# Patient Record
Sex: Male | Born: 1950 | Hispanic: No | Marital: Married | Smoking: Former smoker
Health system: Southern US, Community
[De-identification: ages and names within clinical notes are randomized; demographics above are authoritative.]

## PROBLEM LIST (undated history)

## (undated) DIAGNOSIS — E119 Type 2 diabetes mellitus without complications: Secondary | ICD-10-CM

## (undated) DIAGNOSIS — I10 Essential (primary) hypertension: Secondary | ICD-10-CM

## (undated) DIAGNOSIS — J45909 Unspecified asthma, uncomplicated: Secondary | ICD-10-CM

## (undated) DIAGNOSIS — K219 Gastro-esophageal reflux disease without esophagitis: Secondary | ICD-10-CM

## (undated) HISTORY — DX: Essential (primary) hypertension: I10

## (undated) HISTORY — DX: Unspecified asthma, uncomplicated: J45.909

## (undated) HISTORY — DX: Type 2 diabetes mellitus without complications: E11.9

## (undated) HISTORY — PX: HERNIA REPAIR: SHX51

## (undated) HISTORY — DX: Gastro-esophageal reflux disease without esophagitis: K21.9

---

## 2007-10-21 ENCOUNTER — Encounter: Payer: Self-pay | Admitting: Gastroenterology

## 2007-10-23 ENCOUNTER — Encounter: Payer: Self-pay | Admitting: Gastroenterology

## 2007-11-06 ENCOUNTER — Encounter: Payer: Self-pay | Admitting: Internal Medicine

## 2007-11-09 ENCOUNTER — Encounter: Payer: Self-pay | Admitting: Gastroenterology

## 2007-11-09 DIAGNOSIS — D371 Neoplasm of uncertain behavior of stomach: Secondary | ICD-10-CM | POA: Insufficient documentation

## 2007-11-09 DIAGNOSIS — D378 Neoplasm of uncertain behavior of other specified digestive organs: Secondary | ICD-10-CM

## 2007-11-09 DIAGNOSIS — D375 Neoplasm of uncertain behavior of rectum: Secondary | ICD-10-CM

## 2007-12-03 ENCOUNTER — Ambulatory Visit: Payer: Self-pay | Admitting: Gastroenterology

## 2007-12-03 ENCOUNTER — Ambulatory Visit (HOSPITAL_COMMUNITY): Admission: RE | Admit: 2007-12-03 | Discharge: 2007-12-03 | Payer: Self-pay | Admitting: Gastroenterology

## 2010-10-17 LAB — BASIC METABOLIC PANEL
BUN: 21
Calcium: 8.9
GFR calc non Af Amer: >60
Potassium: 4.8
Sodium: 138

## 2010-10-17 LAB — GLUCOSE, CAPILLARY: Glucose-Capillary: 119 — ABNORMAL HIGH

## 2015-02-23 ENCOUNTER — Ambulatory Visit (INDEPENDENT_AMBULATORY_CARE_PROVIDER_SITE_OTHER): Payer: Medicaid Other | Admitting: Allergy and Immunology

## 2015-02-23 ENCOUNTER — Encounter: Payer: Self-pay | Admitting: Allergy and Immunology

## 2015-02-23 VITALS — BP 118/72 | HR 80 | Temp 97.7°F | Resp 20 | Ht 69.0 in | Wt 205.0 lb

## 2015-02-23 DIAGNOSIS — J309 Allergic rhinitis, unspecified: Secondary | ICD-10-CM | POA: Diagnosis not present

## 2015-02-23 DIAGNOSIS — J4541 Moderate persistent asthma with (acute) exacerbation: Secondary | ICD-10-CM | POA: Diagnosis not present

## 2015-02-23 DIAGNOSIS — H1010 Acute atopic conjunctivitis, unspecified eye: Secondary | ICD-10-CM

## 2015-02-23 MED ORDER — METHYLPREDNISOLONE ACETATE 80 MG/ML IJ SUSP
80.0000 mg | Freq: Once | INTRAMUSCULAR | Status: AC
Start: 2015-02-23 — End: 2015-02-23
  Administered 2015-02-23: 80 mg via INTRAMUSCULAR

## 2015-02-23 MED ORDER — FLUTICASONE-SALMETEROL 500-50 MCG/DOSE IN AEPB: 1.0000 | INHALATION_SPRAY | Freq: Two times a day (BID) | RESPIRATORY_TRACT | Status: DC

## 2015-02-23 MED ORDER — ALBUTEROL SULFATE HFA 108 (90 BASE) MCG/ACT IN AERS: 2.0000 | INHALATION_SPRAY | RESPIRATORY_TRACT | Status: DC | PRN

## 2015-02-23 NOTE — Progress Notes (Signed)
Follow-up Note  Referring Provider: No ref. provider found Primary Provider: Nilda Simmer, MD Date of Office Visit: 02/23/2015  Subjective:   Bobby Wheeler (DOB: [birth year]) is a 65 y.o. male who returns to the [facility] on 02/23/2015 in re-evaluation of the following:  HPI Comments: [name] presents this clinic with complaints of wheezing and coughing and nasal congestion without any ugly nasal discharge or ugly sputum production or chest pain or high fever. This started approximately one week ago. Initially he did have chills. He unfortunately ran out of his Advair about 2 weeks ago and does not have a short acting bronchodilator. Apparently he was doing quite well while using Advair over the course of the past 6 months without any exacerbations of his asthma until this event. On occasion he'll use an antihistamine for upper airway symptoms.   Current outpatient prescriptions:  .  amLODipine-benazepril (LOTREL) 10-20 MG capsule, Take 1 capsule by mouth daily. , Disp: , Rfl:  .  aspirin EC 81 MG tablet, 1 mg., Disp: , Rfl:  .  atorvastatin (LIPITOR) 20 MG tablet, Take 20 mg by mouth daily. , Disp: , Rfl:  .  esomeprazole (NEXIUM) 40 MG capsule, 40 mg 2 (two) times daily before a meal. , Disp: , Rfl:  .  hydrochlorothiazide (HYDRODIURIL) 25 MG tablet, Take 25 mg by mouth daily. , Disp: , Rfl:  .  ibuprofen (ADVIL,MOTRIN) 800 MG tablet, Take 800 mg by mouth every 8 (eight) hours as needed. , Disp: , Rfl:  .  metFORMIN (GLUCOPHAGE) 1000 MG tablet, 1,000 mg 2 (two) times daily with a meal. , Disp: , Rfl:  .  zolpidem (AMBIEN CR) 12.5 MG CR tablet, Take 12.5 mg by mouth at bedtime as needed for sleep., Disp: , Rfl:  .  albuterol (PROAIR HFA) 108 (90 Base) MCG/ACT inhaler, Inhale 2 puffs into the lungs every 4 (four) hours as needed for wheezing or shortness of breath. Reported on 02/23/2015, Disp: , Rfl:  .  Fluticasone-Salmeterol (ADVAIR DISKUS) 500-50 MCG/DOSE AEPB, Inhale 1 puff  into the lungs 2 (two) times daily., Disp: , Rfl:   No orders of the defined types were placed in this encounter.    Past Medical History  Diagnosis Date  . Asthma   . Hypertension   . Diabetes [facility])     Past Surgical History  Procedure Laterality Date  . Hernia repair      No Known Allergies  Review of systems negative except as noted in HPI / PMHx or noted below:  Review of Systems  Constitutional: Negative.   HENT: Negative.   Eyes: Negative.   Respiratory: Negative.   Cardiovascular: Negative.   Gastrointestinal: Negative.   Genitourinary: Negative.   Musculoskeletal: Negative.   Skin: Negative.   Neurological: Negative.   Endo/Heme/Allergies: Negative.   Psychiatric/Behavioral: Negative.      Objective:   Filed Vitals:   02/23/15 1633  BP: 118/72  Pulse: 80  Resp: 20   Height: 5\' 9"  (175.3 cm)  Weight: 205 lb 0.4 oz (93 kg)   Physical Exam  Constitutional: He is well-developed, well-nourished, and in no distress.  HENT:  Head: Normocephalic.  Right Ear: Tympanic membrane, external ear and ear canal normal.  Left Ear: Tympanic membrane, external ear and ear canal normal.  Nose: Nose normal. No mucosal edema or rhinorrhea.  Mouth/Throat: Uvula is midline, oropharynx is clear and moist and mucous membranes are normal. No oropharyngeal exudate.  Eyes: Conjunctivae are normal.  Neck: Trachea normal. No tracheal tenderness present. No tracheal deviation present. No thyromegaly present.  Cardiovascular: Normal rate, regular rhythm, S1 normal, S2 normal and normal heart sounds.   No murmur heard. Pulmonary/Chest: No stridor. No respiratory distress. He has wheezes (bilateral inspiratory and expiratory wheezes). He has no rales.  Musculoskeletal: He exhibits no edema.  Lymphadenopathy:       Head (right side): No tonsillar adenopathy present.       Head (left side): No tonsillar adenopathy present.    He has no cervical adenopathy.    He has no axillary  adenopathy.  Neurological: He is alert. Gait normal.  Skin: No rash noted. He is not diaphoretic. No erythema. Nails show no clubbing.  Psychiatric: Mood and affect normal.    Diagnostics:    Spirometry was performed and demonstrated an FEV1 of 2.22 at 67 % of predicted.  The patient had an Asthma Control Test with the following results:  .    Assessment and Plan:   1. Asthma, not well controlled, moderate persistent, with acute exacerbation   2. Allergic rhinoconjunctivitis      1. Ipratropium/albuterol nebulization delivered in the clinic  2. Depo-Medrol 80 mg IM delivered in the clinic  3. Restart Advair 500 one inhalation twice a day  4. Use ProAir HFA 2 puffs every 4-6 hours if needed  5. Can use over-the-counter nasal saline if needed  6. Can use over-the-counter antihistamine if needed  7. Contact clinic if not doing well with plan  8. Return to clinic in 6 months or earlier if problem  I will have Dalles utilize the therapy mentioned above to treat his asthma exacerbation which appeared to be precipitated by a viral respiratory tract infection. If he responds as he has done in the past he will do quite well with this plan and he should have his asthma under good control while consistently using his Advair 500 one inhalation twice a day. He will contact me should he have difficulty with this plan but otherwise we'll see him back in this clinic in 6 months or earlier if there is a problem.  Allena Katz, MD [place]

## 2015-02-23 NOTE — Patient Instructions (Signed)
  1. Ipratropium/albuterol nebulization delivered in the clinic  2. Depo-Medrol 80 mg IM delivered in the clinic  3. Restart Advair 500 one inhalation twice a day  4. Use ProAir HFA 2 puffs every 4-6 hours if needed  5. Can use over-the-counter nasal saline if needed  6. Can use over-the-counter antihistamine if needed  7. Contact clinic if not doing well with plan  8. Return to clinic in 6 months or earlier if problem

## 2015-03-09 ENCOUNTER — Ambulatory Visit (INDEPENDENT_AMBULATORY_CARE_PROVIDER_SITE_OTHER): Payer: Medicaid Other | Admitting: Allergy and Immunology

## 2015-03-09 ENCOUNTER — Encounter: Payer: Self-pay | Admitting: Allergy and Immunology

## 2015-03-09 VITALS — BP 104/68 | HR 88 | Resp 14

## 2015-03-09 DIAGNOSIS — H1010 Acute atopic conjunctivitis, unspecified eye: Secondary | ICD-10-CM | POA: Diagnosis not present

## 2015-03-09 DIAGNOSIS — J309 Allergic rhinitis, unspecified: Secondary | ICD-10-CM

## 2015-03-09 DIAGNOSIS — J4541 Moderate persistent asthma with (acute) exacerbation: Secondary | ICD-10-CM

## 2015-03-09 MED ORDER — MONTELUKAST SODIUM 10 MG PO TABS: 10.0000 mg | ORAL_TABLET | Freq: Every day | ORAL | Status: DC

## 2015-03-09 MED ORDER — HYDROCOD POLST-CPM POLST ER 10-8 MG/5ML PO SUER: ORAL | Status: DC

## 2015-03-09 NOTE — Progress Notes (Signed)
2.38    Follow-up Note  Referring Provider: Delilah Shan, MD Primary Provider: Nilda Simmer, MD Date of Office Visit: 03/09/2015  Subjective:   Bobby Wheeler (DOB: [birth year]) is a 65 y.o. male who returns to the [facility] [place] on 03/09/2015 in re-evaluation of the following:  HPI Comments: Tanor presents to this clinic in evaluation of continued cough in the face of medical therapy administered during his last evaluation on 02/23/2015 and in addition to the administration of azithromycin by his primary care doctor last week. He just can't get his cough under good control. It does not appear to respond to a short acting bronchodilator. He does have some throat clearing and feelings other something stuck in his throat on occasion. His head feels a little bit full but he does not have any nasal congestion or ugly nasal discharge or anosmia. He's not had any chest pain or ugly sputum production. He has not had any fever   Outpatient Prescriptions Prior to Visit  Medication Sig Dispense Refill  . albuterol (PROAIR HFA) 108 (90 Base) MCG/ACT inhaler Inhale 2 puffs into the lungs every 4 (four) hours as needed for wheezing or shortness of breath. Reported on 02/23/2015 18 g 5  . amLODipine-benazepril (LOTREL) 10-20 MG capsule Take 1 capsule by mouth daily.     Marland Kitchen aspirin EC 81 MG tablet Take 81 mg by mouth daily.     Marland Kitchen esomeprazole (NEXIUM) 40 MG capsule Take 40 mg by mouth daily.     . Fluticasone-Salmeterol (ADVAIR DISKUS) 500-50 MCG/DOSE AEPB Inhale 1 puff into the lungs 2 (two) times daily. 60 each 1  . hydrochlorothiazide (HYDRODIURIL) 25 MG tablet Take 25 mg by mouth daily.     Marland Kitchen ibuprofen (ADVIL,MOTRIN) 800 MG tablet Take 800 mg by mouth every 8 (eight) hours as needed.     . metFORMIN (GLUCOPHAGE) 1000 MG tablet 1,000 mg. Take two tablets once daily as directed.    . zolpidem (AMBIEN CR) 12.5 MG CR tablet Take 12.5 mg by mouth at bedtime as needed for sleep.    Marland Kitchen atorvastatin  (LIPITOR) 20 MG tablet Take 20 mg by mouth daily.      No facility-administered medications prior to visit.    Meds ordered this encounter  Medications  . chlorpheniramine-HYDROcodone (TUSSIONEX PENNKINETIC ER) 10-8 MG/5ML SUER    Sig: Can use 1/2 to 1 teaspoonful every 12 hours as needed for cough.    Dispense:  60 mL    Refill:  0  . montelukast (SINGULAIR) 10 MG tablet    Sig: Take 1 tablet (10 mg total) by mouth daily.    Dispense:  30 tablet    Refill:  5    Past Medical History  Diagnosis Date  . Asthma   . Hypertension   . Diabetes [facility])     Past Surgical History  Procedure Laterality Date  . Hernia repair      No Known Allergies  Review of systems negative except as noted in HPI / PMHx or noted below:  Review of Systems  Constitutional: Negative.   HENT: Negative.   Eyes: Negative.   Respiratory: Negative.   Cardiovascular: Negative.        Systemic arterial hypertension treated with a ACE inhibitor for greater than 10 years  Gastrointestinal: Negative.   Genitourinary: Negative.   Musculoskeletal: Negative.   Skin: Negative.   Neurological: Negative.   Endo/Heme/Allergies: Negative.   Psychiatric/Behavioral: Negative.      Objective:   Filed  Vitals:   03/09/15 1535  BP: 104/68  Pulse: 88  Resp: 14          Physical Exam  Constitutional: He is well-developed, well-nourished, and in no distress.  HENT:  Head: Normocephalic.  Right Ear: Tympanic membrane, external ear and ear canal normal.  Left Ear: Tympanic membrane, external ear and ear canal normal.  Nose: Nose normal. No mucosal edema or rhinorrhea.  Mouth/Throat: Uvula is midline, oropharynx is clear and moist and mucous membranes are normal. No oropharyngeal exudate.  Eyes: Conjunctivae are normal.  Neck: Trachea normal. No tracheal tenderness present. No tracheal deviation present. No thyromegaly present.  Cardiovascular: Normal rate, regular rhythm, S1 normal, S2 normal and normal  heart sounds.   No murmur heard. Pulmonary/Chest: Breath sounds normal. No stridor. No respiratory distress. He has no wheezes. He has no rales.  Musculoskeletal: He exhibits no edema.  Lymphadenopathy:       Head (right side): No tonsillar adenopathy present.       Head (left side): No tonsillar adenopathy present.    He has no cervical adenopathy.    He has no axillary adenopathy.  Neurological: He is alert. Gait normal.  Skin: No rash noted. He is not diaphoretic. No erythema. Nails show no clubbing.  Psychiatric: Mood and affect normal.    Diagnostics:    Spirometry was performed and demonstrated an FEV1 2.38 at 71 % of predicted.  The patient had an Asthma Control Test with the following results:  .    Assessment and Plan:   1. Asthma, not well controlled, moderate persistent, with acute exacerbation   2. Allergic rhinoconjunctivitis     1. May use Tussionex 2.5/5 ML's every 12 hours as needed for cough. Warning about narcotic. 71 ML's. No refill  2. Obtain a chest x-ray  3. Continue Advair 500 one inhalation twice a day  4. Start montelukast 10 mg one tablet one time per day  5. Use ProAir HFA 2 puffs every 4-6 hours if needed  6. Can use over-the-counter nasal saline if needed  7. Can use over-the-counter antihistamine if needed  8. Return to clinic in 2 weeks or earlier if problem   it is not entirely clear why Orestes continues to have cough at this point in time. We will obtain a chest x-ray and I have added an additional anti-inflammatory therapy for his respiratory tract inflammation including montelukast in addition to his Advair. I given him some cough suppressants utilizing a narcotic-based cough suppressant and we had a talk with him today about the fact that this is a narcotic. We'll make a decision about how to proceed pending his response and the results of his chest x-ray.  Allena Katz, MD [place]

## 2015-03-09 NOTE — Patient Instructions (Signed)
  1. May use Tussionex 2.5/5 ML's every 12 hours as needed for cough. Warning about narcotic. 40 ML's. No refill  2. Obtain a chest x-ray  3. Continue Advair 500 one inhalation twice a day  4. Start montelukast 10 mg one tablet one time per day  5. Use ProAir HFA 2 puffs every 4-6 hours if needed  6. Can use over-the-counter nasal saline if needed  7. Can use over-the-counter antihistamine if needed  8. Return to clinic in 2 weeks or earlier if problem

## 2015-03-16 ENCOUNTER — Encounter: Payer: Self-pay | Admitting: Allergy and Immunology

## 2015-04-27 ENCOUNTER — Encounter: Payer: Self-pay | Admitting: Allergy and Immunology

## 2015-04-27 ENCOUNTER — Ambulatory Visit (INDEPENDENT_AMBULATORY_CARE_PROVIDER_SITE_OTHER): Payer: Medicaid Other | Admitting: Allergy and Immunology

## 2015-04-27 VITALS — BP 120/88 | HR 68 | Resp 20

## 2015-04-27 DIAGNOSIS — J4541 Moderate persistent asthma with (acute) exacerbation: Secondary | ICD-10-CM | POA: Diagnosis not present

## 2015-04-27 DIAGNOSIS — H1010 Acute atopic conjunctivitis, unspecified eye: Secondary | ICD-10-CM | POA: Diagnosis not present

## 2015-04-27 DIAGNOSIS — J309 Allergic rhinitis, unspecified: Secondary | ICD-10-CM

## 2015-04-27 MED ORDER — MOMETASONE FUROATE 50 MCG/ACT NA SUSP
NASAL | Status: DC
Start: 2015-04-27 — End: 2015-07-31

## 2015-04-27 MED ORDER — OLOPATADINE HCL 0.7 % OP SOLN: 1.0000 [drp] | OPHTHALMIC | Status: DC

## 2015-04-27 NOTE — Progress Notes (Signed)
Follow-up Note  Referring Provider: Delilah Shan, MD Primary Provider: Nilda Simmer, MD Date of Office Visit: 04/27/2015  Subjective:   Bobby Wheeler (DOB: 1950-08-17) is a 65 y.o. male who returns to the [place] on 04/27/2015 in re-evaluation of the following:  HPI Comments: Samyar returns to this clinic in evaluation of his asthma and allergic rhinitis and history of atopic dermatitis. Over the course of the past week or so he's developed problems with nasal congestion and sneezing and very red itchy eyes. He does not think that he's had much problems with his asthma during this event. He has not had use a short acting bronchodilator. He has no associated fever or headaches or other symptoms suggesting infection.     Medication List           albuterol 108 (90 Base) MCG/ACT inhaler  Commonly known as:  PROAIR HFA  Inhale 2 puffs into the lungs every 4 (four) hours as needed for wheezing or shortness of breath. Reported on 02/23/2015     amLODipine-benazepril 10-20 MG capsule  Commonly known as:  LOTREL  Take 1 capsule by mouth daily.     aspirin EC 81 MG tablet  Take 81 mg by mouth daily.     chlorpheniramine-HYDROcodone 10-8 MG/5ML Suer  Commonly known as:  TUSSIONEX PENNKINETIC ER  Can use 1/2 to 1 teaspoonful every 12 hours as needed for cough.     Fluticasone-Salmeterol 500-50 MCG/DOSE Aepb  Commonly known as:  ADVAIR DISKUS  Inhale 1 puff into the lungs 2 (two) times daily.     hydrochlorothiazide 25 MG tablet  Commonly known as:  HYDRODIURIL  Take 25 mg by mouth daily.     ibuprofen 800 MG tablet  Commonly known as:  ADVIL,MOTRIN  Take 800 mg by mouth every 8 (eight) hours as needed.     metFORMIN 1000 MG tablet  Commonly known as:  GLUCOPHAGE  1,000 mg. Take two tablets once daily as directed.     montelukast 10 MG tablet  Commonly known as:  SINGULAIR  Take 1 tablet (10 mg total) by mouth daily.     NEXIUM 40 MG capsule  Generic  drug:  esomeprazole  Take 40 mg by mouth daily.     zolpidem 12.5 MG CR tablet  Commonly known as:  AMBIEN CR  Take 12.5 mg by mouth at bedtime as needed for sleep.        Past Medical History  Diagnosis Date  . Asthma   . Hypertension   . Diabetes [facility])     Past Surgical History  Procedure Laterality Date  . Hernia repair      No Known Allergies  Review of systems negative except as noted in HPI / PMHx or noted below:  Review of Systems  Constitutional: Negative.   HENT: Negative.   Eyes: Negative.   Respiratory: Negative.   Cardiovascular: Negative.   Gastrointestinal: Negative.   Genitourinary: Negative.   Musculoskeletal: Negative.   Skin: Negative.   Neurological: Negative.   Endo/Heme/Allergies: Negative.   Psychiatric/Behavioral: Negative.      Objective:   Filed Vitals:   04/27/15 1134  BP: 120/88  Pulse: 68  Resp: 20          Physical Exam  Constitutional: He is well-developed, well-nourished, and in no distress.  HENT:  Head: Normocephalic.  Right Ear: Tympanic membrane, external ear and ear canal normal.  Left Ear: Tympanic membrane, external ear and ear canal  normal.  Nose: Mucosal edema present. No rhinorrhea.  Mouth/Throat: Uvula is midline, oropharynx is clear and moist and mucous membranes are normal. No oropharyngeal exudate.  Eyes: Right conjunctiva is injected. Left conjunctiva is injected.  Neck: Trachea normal. No tracheal tenderness present. No tracheal deviation present. No thyromegaly present.  Cardiovascular: Normal rate, regular rhythm, S1 normal, S2 normal and normal heart sounds.   No murmur heard. Pulmonary/Chest: No stridor. No respiratory distress. He has wheezes (End expiratory wheezing on forced expiration). He has no rales.  Musculoskeletal: He exhibits no edema.  Lymphadenopathy:       Head (right side): No tonsillar adenopathy present.       Head (left side): No tonsillar adenopathy present.    He has no  cervical adenopathy.  Neurological: He is alert. Gait normal.  Skin: No rash noted. He is not diaphoretic. No erythema. Nails show no clubbing.  Psychiatric: Mood and affect normal.    Diagnostics:    Spirometry was performed and demonstrated an FEV1 of 2.20 at 66 % of predicted.  The patient had an Asthma Control Test with the following results:  .    Assessment and Plan:   1. Asthma, not well controlled, moderate persistent, with acute exacerbation   2. Allergic rhinoconjunctivitis     1. Continue Advair 500 one inhalation twice a day  2. Continue montelukast 10 mg one tablet one time per day  3. Start Nasonex 1-2 sprays each nostril one time per day  4. Start Pazeo one drop each eye one time per day  5. Start Prednisone 10mg  one tablet one time per day for 10 days. 20mg  now  6. Use ProAir HFA 2 puffs every 4-6 hours if needed  7. Can use over-the-counter nasal saline if needed  8. Can use over-the-counter antihistamine if needed  9. Return to clinic in 2 weeks or earlier if problem  Nirmal appears to have developed a flare of his atopic respiratory and conjunctival disease for which she will utilize the therapy mentioned above and we will see him back in this clinic in approximately 2 weeks or earlier if there is a problem.  Allena Katz, MD [place]

## 2015-04-27 NOTE — Patient Instructions (Signed)
  1. Continue Advair 500 one inhalation twice a day  2. Continue montelukast 10 mg one tablet one time per day  3. Start Nasonex 1-2 sprays each nostril one time per day  4. Start Pazeo one drop each eye one time per day  5. Start Prednisone 10mg  one tablet one time per day for 10 days. 20mg  now  6. Use ProAir HFA 2 puffs every 4-6 hours if needed  7. Can use over-the-counter nasal saline if needed  8. Can use over-the-counter antihistamine if needed  9. Return to clinic in 2 weeks or earlier if problem

## 2015-07-31 ENCOUNTER — Ambulatory Visit (INDEPENDENT_AMBULATORY_CARE_PROVIDER_SITE_OTHER): Payer: Medicaid Other | Admitting: Allergy and Immunology

## 2015-07-31 ENCOUNTER — Encounter: Payer: Self-pay | Admitting: Allergy and Immunology

## 2015-07-31 VITALS — BP 130/80 | HR 80 | Resp 16

## 2015-07-31 DIAGNOSIS — J4541 Moderate persistent asthma with (acute) exacerbation: Secondary | ICD-10-CM

## 2015-07-31 DIAGNOSIS — J387 Other diseases of larynx: Secondary | ICD-10-CM | POA: Diagnosis not present

## 2015-07-31 DIAGNOSIS — J309 Allergic rhinitis, unspecified: Secondary | ICD-10-CM

## 2015-07-31 DIAGNOSIS — K219 Gastro-esophageal reflux disease without esophagitis: Secondary | ICD-10-CM

## 2015-07-31 DIAGNOSIS — H1010 Acute atopic conjunctivitis, unspecified eye: Secondary | ICD-10-CM | POA: Diagnosis not present

## 2015-07-31 MED ORDER — METHYLPREDNISOLONE ACETATE 80 MG/ML IJ SUSP
80.0000 mg | Freq: Once | INTRAMUSCULAR | Status: AC
  Administered 2015-07-31: 80 mg via INTRAMUSCULAR

## 2015-07-31 MED ORDER — BECLOMETHASONE DIPROPIONATE 80 MCG/ACT NA AERS: INHALATION_SPRAY | NASAL | Status: DC

## 2015-07-31 NOTE — Progress Notes (Signed)
Follow-up Note  Referring Provider: Delilah Shan, MD Primary Provider: Nilda Simmer, MD Date of Office Visit: 07/31/2015  Subjective:   Bobby Wheeler (DOB: 1950-03-04) is a 65 y.o. male who returns to the [facility] [place] on 07/31/2015 in re-evaluation of the following:  HPI: Bobby Wheeler presents this clinic in evaluation of persistent respiratory tract symptoms. I last saw him in his clinic in April 2017.  Bobby Wheeler states that he's continued to have problems with cough since then and occasional shortness of breath and chest tightness and his symptoms do appear to respond to a short acting bronchodilator which he uses very infrequently. For some reason he was given an Advair 100 instead of an Advair 500 during his last prescription refill. He did have an x-ray in February 2017 which did not identify any significant abnormality  As well, he has been having lots of stuffiness of his nose and it does not appear as though Nasonex helped this issue very much. Apparently he might of been given nasal fluticasone as well in the past. Once again he is not sure that this is helped him very much. He does not have any anosmia or ugly nasal discharge.  He thinks his reflux is under very good control with his Nexium. He does not have a tremendous amount of throat issue such as throat clearing or raspy hoarse voice.    Medication List           albuterol 108 (90 Base) MCG/ACT inhaler  Commonly known as:  PROAIR HFA  Inhale 2 puffs into the lungs every 4 (four) hours as needed for wheezing or shortness of breath. Reported on 02/23/2015     amLODipine-benazepril 10-20 MG capsule  Commonly known as:  LOTREL  Take 1 capsule by mouth daily.     aspirin EC 81 MG tablet  Take 81 mg by mouth daily.     Fluticasone-Salmeterol 500-50 MCG/DOSE Aepb  Commonly known as:  ADVAIR DISKUS  Inhale 1 puff into the lungs 2 (two) times daily.     hydrochlorothiazide 25 MG tablet  Commonly known as:   HYDRODIURIL  Take 25 mg by mouth daily.     ibuprofen 800 MG tablet  Commonly known as:  ADVIL,MOTRIN  Take 800 mg by mouth every 8 (eight) hours as needed.     metFORMIN 1000 MG tablet  Commonly known as:  GLUCOPHAGE  1,000 mg. Take two tablets once daily as directed.     mometasone 50 MCG/ACT nasal spray  Commonly known as:  NASONEX  1-2 sprays each nostril one time per day     montelukast 10 MG tablet  Commonly known as:  SINGULAIR  Take 1 tablet (10 mg total) by mouth daily.     NEXIUM 40 MG capsule  Generic drug:  esomeprazole  Take 40 mg by mouth daily.     Olopatadine HCl 0.7 % Soln  Commonly known as:  PAZEO  Place 1 drop into both eyes 1 day or 1 dose.     zolpidem 12.5 MG CR tablet  Commonly known as:  AMBIEN CR  Take 12.5 mg by mouth at bedtime as needed for sleep.        Past Medical History  Diagnosis Date  . Asthma   . Hypertension   . Diabetes [facility])     Past Surgical History  Procedure Laterality Date  . Hernia repair      No Known Allergies  Review of systems negative except as  noted in HPI / PMHx or noted below:  Review of Systems  Constitutional: Negative.   HENT: Negative.   Eyes: Negative.   Respiratory: Negative.   Cardiovascular: Negative.   Gastrointestinal: Negative.   Genitourinary: Negative.   Musculoskeletal: Negative.   Skin: Negative.   Neurological: Negative.   Endo/Heme/Allergies: Negative.   Psychiatric/Behavioral: Negative.      Objective:   Filed Vitals:   07/31/15 1610  BP: 130/80  Pulse: 80  Resp: 16          Physical Exam  Constitutional: He is well-developed, well-nourished, and in no distress.  Nasal voice  HENT:  Head: Normocephalic.  Right Ear: Tympanic membrane, external ear and ear canal normal.  Left Ear: Tympanic membrane, external ear and ear canal normal.  Nose: Mucosal edema present. No rhinorrhea.  Mouth/Throat: Uvula is midline, oropharynx is clear and moist and mucous membranes are  normal. No oropharyngeal exudate.  Eyes: Right conjunctiva is injected. Left conjunctiva is injected.  Neck: Trachea normal. No tracheal tenderness present. No tracheal deviation present. No thyromegaly present.  Cardiovascular: Normal rate, regular rhythm, S1 normal, S2 normal and normal heart sounds.   No murmur heard. Pulmonary/Chest: Breath sounds normal. No stridor. No respiratory distress. He has no wheezes. He has no rales.  Musculoskeletal: He exhibits no edema.  Lymphadenopathy:       Head (right side): No tonsillar adenopathy present.       Head (left side): No tonsillar adenopathy present.    He has no cervical adenopathy.  Neurological: He is alert. Gait normal.  Skin: No rash noted. He is not diaphoretic. No erythema. Nails show no clubbing.  Psychiatric: Mood and affect normal.    Diagnostics:    Spirometry was performed and demonstrated an FEV1 of 2.31 at 69 % of predicted.  The patient had an Asthma Control Test with the following results: ACT Total Score: 11.    Assessment and Plan:   1. Asthma, not well controlled, moderate persistent, with acute exacerbation   2. Allergic rhinoconjunctivitis   3. LPRD (laryngopharyngeal reflux disease)      1. Continue Advair 500 one inhalation twice a day  2. Continue montelukast 10 mg one tablet one time per day  3. Start Qnasl 80 1-2 sprays each nostril one time per day. Replaces Nasonex  4. Continue Pazeo one drop each eye one time per day  5. Depo-Medrol 80 IM delivered in clinic  6. Use ProAir HFA 2 puffs every 4-6 hours if needed  7. Can use over-the-counter nasal saline and antihistamine if needed  8. Continue Nexium 40 mg once a day  9. Check IgE level to check for qualification for Xolair  10. Return to clinic in 2 weeks or earlier if problem  Bobby Wheeler is not under control regarding his respiratory tract atopic disease and I think it is now time to see if he would qualify for a biological agent to treat this  condition while he continues to use a large collection of anti-inflammatory medications for his respiratory tract as stated above. I'll contact him with the results of his blood tests once it's available for review. I'll see him back in this clinic in 2 weeks or earlier if there is a problem.   Allena Katz, MD [place]

## 2015-07-31 NOTE — Patient Instructions (Addendum)
  1. Continue Advair 500 one inhalation twice a day  2. Continue montelukast 10 mg one tablet one time per day  3. Start Qnasl 80 1-2 sprays each nostril one time per day. Replaces Nasonex  4. Continue Pazeo one drop each eye one time per day  5. Depo-Medrol 80 IM delivered in clinic  6. Use ProAir HFA 2 puffs every 4-6 hours if needed  7. Can use over-the-counter nasal saline and antihistamine if needed  8. Continue Nexium 40 mg once a day  9. Check IgE level to check for qualification for Xolair  10. Return to clinic in 2 weeks or earlier if problem

## 2015-08-01 LAB — IGE: IGE (IMMUNOGLOBULIN E), SERUM: 360 [IU]/mL — AB (ref 0–100)

## 2015-08-10 ENCOUNTER — Ambulatory Visit: Payer: Medicaid Other | Admitting: Allergy and Immunology

## 2015-08-14 ENCOUNTER — Ambulatory Visit (INDEPENDENT_AMBULATORY_CARE_PROVIDER_SITE_OTHER): Payer: Medicaid Other | Admitting: *Deleted

## 2015-08-14 DIAGNOSIS — J4540 Moderate persistent asthma, uncomplicated: Secondary | ICD-10-CM

## 2015-08-14 MED ORDER — OMALIZUMAB 150 MG ~~LOC~~ SOLR
375.0000 mg | SUBCUTANEOUS | Status: DC
  Administered 2015-08-14 – 2016-10-21 (×17): 375 mg via SUBCUTANEOUS

## 2015-08-14 MED ORDER — EPINEPHRINE 0.3 MG/0.3ML IJ SOAJ: 0.3000 mg | Freq: Once | INTRAMUSCULAR | 2 refills | Status: AC

## 2015-08-14 NOTE — Progress Notes (Signed)
PATIENT STARTED XOLAIR INJS 375MG  EVERY 14 DAYS FOR ASTHMA. REVIEWED SIDE EFFECTS, SCHEDULE AND HOW/WHEN TO USE EPIPEN WITH PATIENT

## 2015-08-21 ENCOUNTER — Ambulatory Visit: Payer: Medicaid Other | Admitting: Allergy and Immunology

## 2015-08-30 ENCOUNTER — Ambulatory Visit (INDEPENDENT_AMBULATORY_CARE_PROVIDER_SITE_OTHER): Payer: Medicaid Other | Admitting: *Deleted

## 2015-08-30 DIAGNOSIS — J4540 Moderate persistent asthma, uncomplicated: Secondary | ICD-10-CM | POA: Diagnosis not present

## 2015-09-04 ENCOUNTER — Encounter: Payer: Self-pay | Admitting: Allergy and Immunology

## 2015-09-04 ENCOUNTER — Ambulatory Visit (INDEPENDENT_AMBULATORY_CARE_PROVIDER_SITE_OTHER): Payer: Medicaid Other | Admitting: Allergy and Immunology

## 2015-09-04 VITALS — BP 122/80 | HR 88 | Resp 16

## 2015-09-04 DIAGNOSIS — J309 Allergic rhinitis, unspecified: Secondary | ICD-10-CM | POA: Diagnosis not present

## 2015-09-04 DIAGNOSIS — H1010 Acute atopic conjunctivitis, unspecified eye: Secondary | ICD-10-CM

## 2015-09-04 DIAGNOSIS — J387 Other diseases of larynx: Secondary | ICD-10-CM

## 2015-09-04 DIAGNOSIS — J4551 Severe persistent asthma with (acute) exacerbation: Secondary | ICD-10-CM | POA: Diagnosis not present

## 2015-09-04 DIAGNOSIS — K219 Gastro-esophageal reflux disease without esophagitis: Secondary | ICD-10-CM

## 2015-09-04 MED ORDER — UMECLIDINIUM BROMIDE 62.5 MCG/INH IN AEPB: 1.0000 | INHALATION_SPRAY | Freq: Every day | RESPIRATORY_TRACT | 5 refills | Status: DC

## 2015-09-04 MED ORDER — ESOMEPRAZOLE MAGNESIUM 40 MG PO CPDR
40.0000 mg | DELAYED_RELEASE_CAPSULE | Freq: Every day | ORAL | 5 refills | Status: DC
Start: 2015-09-04 — End: 2016-01-17

## 2015-09-04 NOTE — Progress Notes (Signed)
Follow-up Note  Referring Provider: Delilah Shan, MD Primary Provider: Nilda Simmer, MD Date of Office Visit: 09/04/2015  Subjective:   Bobby Wheeler (DOB: 1950-04-03) is a 65 y.o. male who returns to the [facility] on 09/04/2015 in re-evaluation of the following:  HPI: Bobby Wheeler returns to this clinic in reevaluation of his asthma and allergic rhinitis and LPR. I last saw him in his clinic on 17th of July 2017 at which time we gave him a systemic steroid for persistent respiratory tract symptoms and have him consistently use his anti-inflammatory medications and also started him on Xolair.  He has much better regarding cough and chest tightness and shortness of breath but he still has a little bit of a lingering cough. Sometimes his cough does wake him up at nighttime. He has not had to use a short acting bronchodilator recently.  His nose has been doing well and he has no issues with inability to smell or taste or ugly nasal discharge or congestion.  He thinks his reflux is under pretty good control this point in time. He has had a little bit more post nasal drainage sensation though which may be associated with this cough.  He is leaving to go [place] tomorrow and will be returning first of November.    Medication List      albuterol 108 (90 Base) MCG/ACT inhaler Commonly known as:  PROAIR HFA Inhale 2 puffs into the lungs every 4 (four) hours as needed for wheezing or shortness of breath. Reported on 02/23/2015   amLODipine-benazepril 10-20 MG capsule Commonly known as:  LOTREL Take 1 capsule by mouth daily.   aspirin EC 81 MG tablet Take 81 mg by mouth daily.   Beclomethasone Dipropionate 80 MCG/ACT Aers Commonly known as:  QNASL USE 1-2 PUFFS IN EACH NOSTRIL ONCE DAILY   Fluticasone-Salmeterol 500-50 MCG/DOSE Aepb Commonly known as:  ADVAIR DISKUS Inhale 1 puff into the lungs 2 (two) times daily.   hydrochlorothiazide 25 MG tablet Commonly known as:   HYDRODIURIL Take 25 mg by mouth daily.   ibuprofen 800 MG tablet Commonly known as:  ADVIL,MOTRIN Take 800 mg by mouth every 8 (eight) hours as needed.   metFORMIN 1000 MG tablet Commonly known as:  GLUCOPHAGE 1,000 mg. Take two tablets once daily as directed.   montelukast 10 MG tablet Commonly known as:  SINGULAIR Take 1 tablet (10 mg total) by mouth daily.   NEXIUM 40 MG capsule Generic drug:  esomeprazole Take 40 mg by mouth daily.   Olopatadine HCl 0.7 % Soln Commonly known as:  PAZEO Place 1 drop into both eyes 1 day or 1 dose.   zolpidem 12.5 MG CR tablet Commonly known as:  AMBIEN CR Take 12.5 mg by mouth at bedtime as needed for sleep.       Past Medical History:  Diagnosis Date  . Asthma   . Diabetes ([place])   . Hypertension     Past Surgical History:  Procedure Laterality Date  . HERNIA REPAIR      No Known Allergies  Review of systems negative except as noted in HPI / PMHx or noted below:  Review of Systems  Constitutional: Negative.   HENT: Negative.   Eyes: Negative.   Respiratory: Negative.   Cardiovascular: Negative.   Gastrointestinal: Negative.   Genitourinary: Negative.   Musculoskeletal: Negative.   Skin: Negative.   Neurological: Negative.   Endo/Heme/Allergies: Negative.   Psychiatric/Behavioral: Negative.      Objective:  Vitals:   09/04/15 1518  BP: 122/80  Pulse: 88  Resp: 16          Physical Exam  Constitutional: He is well-developed, well-nourished, and in no distress.  HENT:  Head: Normocephalic.  Right Ear: Tympanic membrane, external ear and ear canal normal.  Left Ear: Tympanic membrane, external ear and ear canal normal.  Nose: Nose normal. No mucosal edema or rhinorrhea.  Mouth/Throat: Uvula is midline, oropharynx is clear and moist and mucous membranes are normal. No oropharyngeal exudate.  Eyes: Conjunctivae are normal.  Neck: Trachea normal. No tracheal tenderness present. No tracheal deviation  present. No thyromegaly present.  Cardiovascular: Normal rate, regular rhythm, S1 normal, S2 normal and normal heart sounds.   No murmur heard. Pulmonary/Chest: Breath sounds normal. No stridor. No respiratory distress. He has no wheezes. He has no rales.  Musculoskeletal: He exhibits no edema.  Lymphadenopathy:       Head (right side): No tonsillar adenopathy present.       Head (left side): No tonsillar adenopathy present.    He has no cervical adenopathy.  Neurological: He is alert. Gait normal.  Skin: No rash noted. He is not diaphoretic. No erythema. Nails show no clubbing.  Psychiatric: Mood and affect normal.    Diagnostics: Results of blood tests obtained on 07/31/2015 identified a serum IgE level 360 international units/mL   Spirometry was performed and demonstrated an FEV1 of 2.22 at 67 % of predicted.  The patient had an Asthma Control Test with the following results:  .    Assessment and Plan:   1. Asthma, not well controlled, severe persistent, with acute exacerbation   2. Allergic rhinoconjunctivitis   3. LPRD (laryngopharyngeal reflux disease)     1. Continue Advair 500 one inhalation twice a day  2. Continue montelukast 10 mg one tablet one time per day  3. Continue Qnasl 80 1-2 puffs each nostril one time per day  4. Continue Pazeo one drop each eye one time per day  5. Continue Xolair and EpiPen: Restart after overseas trip in November  6. Use ProAir HFA 2 puffs every 4-6 hours if needed  7. Can use over-the-counter nasal saline and antihistamine if needed  8. Increase Nexium 40 mg twice a day  9. Start Incruse one inhalation one time per day  9. Obtain fall flu vaccine  10. Return to clinic in 12 weeks or earlier if problem  I will have Sudeys continue to use anti-inflammatory medications and I'll add an anticholinergic agent and increase his dose of Nexium to cover the possibilities of persistent inflammation of his lower airway and possible  reflux-induced respiratory disease respectively. We'll see how things go over the course the next month or so. Unfortunately, he will be leaving the country and will not return until November. Further evaluation treatment will be based upon his response.  Allena Katz, MD [place]

## 2015-09-04 NOTE — Patient Instructions (Addendum)
  1. Continue Advair 500 one inhalation twice a day  2. Continue montelukast 10 mg one tablet one time per day  3. Continue Qnasl 80 1-2 puffs each nostril one time per day  4. Continue Pazeo one drop each eye one time per day  5. Continue Xolair and EpiPen: Restart after overseas trip in November  6. Use ProAir HFA 2 puffs every 4-6 hours if needed  7. Can use over-the-counter nasal saline and antihistamine if needed  8. Increase Nexium 40 mg twice a day  9. Start Incruse one inhalation one time per day  9. Obtain fall flu vaccine  10. Return to clinic in 12 weeks or earlier if problem

## 2015-09-07 ENCOUNTER — Other Ambulatory Visit: Payer: Self-pay | Admitting: *Deleted

## 2015-09-07 MED ORDER — TIOTROPIUM BROMIDE MONOHYDRATE 18 MCG IN CAPS: 18.0000 ug | ORAL_CAPSULE | RESPIRATORY_TRACT | 5 refills | Status: DC

## 2015-09-07 NOTE — Telephone Encounter (Signed)
Per Dr Neldon Mc rx for Spiriva Handihaler sent to pharmacy to replace Incruse due to ins

## 2015-11-20 ENCOUNTER — Ambulatory Visit (INDEPENDENT_AMBULATORY_CARE_PROVIDER_SITE_OTHER): Payer: Medicaid Other | Admitting: *Deleted

## 2015-11-20 DIAGNOSIS — J4540 Moderate persistent asthma, uncomplicated: Secondary | ICD-10-CM

## 2015-12-23 ENCOUNTER — Other Ambulatory Visit: Payer: Self-pay | Admitting: Allergy and Immunology

## 2015-12-25 ENCOUNTER — Ambulatory Visit (INDEPENDENT_AMBULATORY_CARE_PROVIDER_SITE_OTHER): Payer: Medicaid Other | Admitting: *Deleted

## 2015-12-25 DIAGNOSIS — J4540 Moderate persistent asthma, uncomplicated: Secondary | ICD-10-CM | POA: Diagnosis not present

## 2016-01-01 ENCOUNTER — Ambulatory Visit (INDEPENDENT_AMBULATORY_CARE_PROVIDER_SITE_OTHER): Payer: Medicaid Other | Admitting: Allergy and Immunology

## 2016-01-01 ENCOUNTER — Encounter: Payer: Self-pay | Admitting: Allergy and Immunology

## 2016-01-01 VITALS — BP 130/80 | HR 96 | Resp 16

## 2016-01-01 DIAGNOSIS — J3089 Other allergic rhinitis: Secondary | ICD-10-CM

## 2016-01-01 DIAGNOSIS — K219 Gastro-esophageal reflux disease without esophagitis: Secondary | ICD-10-CM

## 2016-01-01 DIAGNOSIS — J4551 Severe persistent asthma with (acute) exacerbation: Secondary | ICD-10-CM | POA: Diagnosis not present

## 2016-01-01 MED ORDER — FLUTICASONE-SALMETEROL 500-50 MCG/DOSE IN AEPB: 1.0000 | INHALATION_SPRAY | Freq: Two times a day (BID) | RESPIRATORY_TRACT | 1 refills | Status: DC

## 2016-01-01 MED ORDER — ALBUTEROL SULFATE HFA 108 (90 BASE) MCG/ACT IN AERS: 2.0000 | INHALATION_SPRAY | RESPIRATORY_TRACT | 5 refills | Status: DC | PRN

## 2016-01-01 MED ORDER — AZITHROMYCIN 500 MG PO TABS: 500.0000 mg | ORAL_TABLET | Freq: Every day | ORAL | 0 refills | Status: AC

## 2016-01-01 MED ORDER — METHYLPREDNISOLONE ACETATE 80 MG/ML IJ SUSP
80.0000 mg | Freq: Once | INTRAMUSCULAR | Status: AC
  Administered 2016-01-01: 80 mg via INTRAMUSCULAR

## 2016-01-01 NOTE — Progress Notes (Signed)
Follow-up Note  Referring Provider: Delilah Shan, MD Primary Provider: Nilda Simmer, MD Date of Office Visit: 01/01/2016  Subjective:   Bobby Wheeler (DOB: [birth year]) is a 65 y.o. male who returns to the [facility] on 01/01/2016 in re-evaluation of the following:  HPI: Bobby Wheeler presents to this clinic in evaluation of his severe asthma and allergic rhinitis and LPR. I've not seen him in his clinic since August 2017.  For some reason his pharmacy was only giving him Advair 100 instead of Advair 500 for the past 2 months and he has developed a cough over the course of the past several weeks. He has no associated fever or nasal symptoms or GI symptoms. He does have wheezing. He's been coughing so hard he has been developing some intermittent chest pain when he coughs.  Allergies as of 01/01/2016   No Known Allergies     Medication List      albuterol 108 (90 Base) MCG/ACT inhaler Commonly known as:  PROAIR HFA Inhale 2 puffs into the lungs every 4 (four) hours as needed for wheezing or shortness of breath. Reported on 02/23/2015   amLODipine-benazepril 10-20 MG capsule Commonly known as:  LOTREL Take 1 capsule by mouth daily.   aspirin EC 81 MG tablet Take 81 mg by mouth daily.   Beclomethasone Dipropionate 80 MCG/ACT Aers Commonly known as:  QNASL USE 1-2 PUFFS IN EACH NOSTRIL ONCE DAILY   esomeprazole 40 MG capsule Commonly known as:  NEXIUM Take 1 capsule (40 mg total) by mouth daily.   Fluticasone-Salmeterol 500-50 MCG/DOSE Aepb Commonly known as:  ADVAIR DISKUS Inhale 1 puff into the lungs 2 (two) times daily.   hydrochlorothiazide 25 MG tablet Commonly known as:  HYDRODIURIL Take 25 mg by mouth daily.   ibuprofen 800 MG tablet Commonly known as:  ADVIL,MOTRIN Take 800 mg by mouth every 8 (eight) hours as needed.   metFORMIN 1000 MG tablet Commonly known as:  GLUCOPHAGE 1,000 mg. Take two tablets once daily as directed.   montelukast 10 MG  tablet Commonly known as:  SINGULAIR TAKE ONE TABLET BY MOUTH ONCE DAILY   Olopatadine HCl 0.7 % Soln Commonly known as:  PAZEO Place 1 drop into both eyes 1 day or 1 dose.   zolpidem 12.5 MG CR tablet Commonly known as:  AMBIEN CR Take 12.5 mg by mouth at bedtime as needed for sleep.       Past Medical History:  Diagnosis Date  . Asthma   . Diabetes ([place])   . Hypertension     Past Surgical History:  Procedure Laterality Date  . HERNIA REPAIR      Review of systems negative except as noted in HPI / PMHx or noted below:  Review of Systems  Constitutional: Negative.   HENT: Negative.   Eyes: Negative.   Respiratory: Negative.   Cardiovascular: Negative.   Gastrointestinal: Negative.   Genitourinary: Negative.   Musculoskeletal: Negative.   Skin: Negative.   Neurological: Negative.   Endo/Heme/Allergies: Negative.   Psychiatric/Behavioral: Negative.      Objective:   Vitals:   01/01/16 1703  BP: 130/80  Pulse: 96  Resp: 16          Physical Exam  Constitutional: He is well-developed, well-nourished, and in no distress.  HENT:  Head: Normocephalic.  Right Ear: Tympanic membrane, external ear and ear canal normal.  Left Ear: Tympanic membrane, external ear and ear canal normal.  Nose: Nose normal. No mucosal edema  or rhinorrhea.  Mouth/Throat: Uvula is midline, oropharynx is clear and moist and mucous membranes are normal. No oropharyngeal exudate.  Eyes: Conjunctivae are normal.  Neck: Trachea normal. No tracheal tenderness present. No tracheal deviation present. No thyromegaly present.  Cardiovascular: Normal rate, regular rhythm, S1 normal, S2 normal and normal heart sounds.   No murmur heard. Pulmonary/Chest: No stridor. No respiratory distress. He has wheezes (bilateral expiratory wheezes in posterior lung fields). He has no rales.  Musculoskeletal: He exhibits no edema.  Lymphadenopathy:       Head (right side): No tonsillar adenopathy present.         Head (left side): No tonsillar adenopathy present.    He has no cervical adenopathy.  Neurological: He is alert. Gait normal.  Skin: No rash noted. He is not diaphoretic. No erythema. Nails show no clubbing.  Psychiatric: Mood and affect normal.    Diagnostics:    Spirometry was performed and demonstrated an FEV1 of 2.18 at 66 % of predicted.  The patient had an Asthma Control Test with the following results: ACT Total Score: 9.    Assessment and Plan:   1. Asthma, not well controlled, severe persistent, with acute exacerbation   2. Other allergic rhinitis   3. LPRD (laryngopharyngeal reflux disease)     1. Return Advair 500 one inhalation twice a day  2. Continue montelukast 10 mg one tablet one time per day  3. Continue Qnasl 80 1-2 puffs each nostril one time per day  4. Continue Pazeo one drop each eye one time per day  5. Continue Xolair and EpiPen   6. Use ProAir HFA 2 puffs every 4-6 hours if needed  7. Can use over-the-counter nasal saline and antihistamine if needed  8. Continue Nexium 40 mg twice a day  9. Depo-Medrol 80 IM delivered in clinic  10. Azithromycin 500 mg once a day for the next 3 days  11. Obtain fall flu vaccine  12. Return to clinic in 12 weeks or earlier if problem  Bobby Wheeler has a respiratory tract inflammatories flareup for which I'll have him use a systemic steroid. To cover the possibility of mycoplasma as a trigger I will give him azithromycin. He'll continue to use a large collection of anti-inflammatory medications directed at both inflammation and reflux as noted above and he'll continue on omalizumab injections as well. I will see him back in this clinic in 12 weeks or earlier if there is a problem.If he still continues to cough in the face of this therapy he may require an imaging procedure of his respiratory tract and we may need to remove his ACE inhibitor.  Bobby Katz, MD [place]

## 2016-01-01 NOTE — Patient Instructions (Addendum)
  1. Return Advair 500 one inhalation twice a day  2. Continue montelukast 10 mg one tablet one time per day  3. Continue Qnasl 80 1-2 puffs each nostril one time per day  4. Continue Pazeo one drop each eye one time per day  5. Continue Xolair and EpiPen   6. Use ProAir HFA 2 puffs every 4-6 hours if needed  7. Can use over-the-counter nasal saline and antihistamine if needed  8. Continue Nexium 40 mg twice a day  9. Depo-Medrol 80 IM delivered in clinic  10. Azithromycin 500 mg once a day for the next 3 days  11. Obtain fall flu vaccine  12. Return to clinic in 12 weeks or earlier if problem

## 2016-01-17 ENCOUNTER — Ambulatory Visit (INDEPENDENT_AMBULATORY_CARE_PROVIDER_SITE_OTHER): Payer: Medicaid Other | Admitting: Allergy

## 2016-01-17 ENCOUNTER — Ambulatory Visit (INDEPENDENT_AMBULATORY_CARE_PROVIDER_SITE_OTHER): Payer: Medicaid Other | Admitting: *Deleted

## 2016-01-17 ENCOUNTER — Encounter: Payer: Self-pay | Admitting: Allergy

## 2016-01-17 VITALS — BP 126/80 | HR 78 | Resp 16

## 2016-01-17 DIAGNOSIS — J4551 Severe persistent asthma with (acute) exacerbation: Secondary | ICD-10-CM | POA: Diagnosis not present

## 2016-01-17 DIAGNOSIS — J4540 Moderate persistent asthma, uncomplicated: Secondary | ICD-10-CM

## 2016-01-17 DIAGNOSIS — K219 Gastro-esophageal reflux disease without esophagitis: Secondary | ICD-10-CM | POA: Diagnosis not present

## 2016-01-17 DIAGNOSIS — J3089 Other allergic rhinitis: Secondary | ICD-10-CM | POA: Diagnosis not present

## 2016-01-17 MED ORDER — ESOMEPRAZOLE MAGNESIUM 40 MG PO CPDR: 40.0000 mg | DELAYED_RELEASE_CAPSULE | Freq: Every day | ORAL | 5 refills | Status: DC

## 2016-01-17 MED ORDER — FLUTICASONE PROPIONATE 50 MCG/ACT NA SUSP: 2.0000 | Freq: Every day | NASAL | 5 refills | Status: DC

## 2016-01-17 NOTE — Patient Instructions (Addendum)
  1. Continue Advair 500 one inhalation twice a day  2. Continue montelukast 10 mg one tablet one time per day  3. Continue Fluticasone 1-2 puffs each nostril one time per day  4. Continue Pazeo one drop each eye one time per day  5. Continue Xolair and EpiPen   6. Use ProAir HFA 2 puffs every 4-6 hours if needed  7. Can use over-the-counter nasal saline and antihistamine if needed  8. Continue Nexium 40 mg twice a day  9. Take prednisone 20mg  twice a day x 5 days  10. Will obtain CXR.  If there is evidence of pneumonia we will call in an antibiotic for you.  11. May try Mucinex 1200mg  (may use twice a day) and drink with tall glass of water to help with cough and thin mucus  12.  If your cough persist would be concerned your Lotrel medication may be contributing to cough as the benazepril has been associated with cough as a side effect  13. Return to clinic in 12 weeks or earlier if problem

## 2016-01-17 NOTE — Progress Notes (Signed)
Follow-up Note  RE: Bobby Wheeler MRN: [id number] DOB: 1951-01-12 Date of Office Visit: 01/17/2016   History of present illness: Bobby Wheeler is a 66 y.o. male presenting today for sick visit. He was seen on 01/01/16 for asthma, allergies and reflux. At that point he was having cough and discovered that he was on Advair 100 instead of 500 g. This was rectified and he is currently on Advair 500 that he takes twice a day. He also received Depo-Medrol injection and azithromycin 5003 days that he completed this course. He did have improvement during the week after the injection however dry cough has worsened again and is waking him from sleep. He is using his ProAir twice a day. He continues on his regular medicines of Singulair, Nexium.  Not currently using any nasal spray. Continues on his Xolair which he did receive today. He has tried Robitussin without much relief.  He denies any fever but has felt somewhat sweaty     Review of systems: Review of Systems  Constitutional: Positive for diaphoresis and malaise/fatigue. Negative for chills and fever.  HENT: Positive for congestion. Negative for ear discharge, ear pain, sinus pain and sore throat.   Eyes: Negative for discharge and redness.  Respiratory: Positive for cough, shortness of breath and wheezing. Negative for sputum production.   Cardiovascular: Positive for chest pain (Worse with coughing and deep inspiration). Negative for palpitations and orthopnea.  Gastrointestinal: Positive for heartburn. Negative for abdominal pain, nausea and vomiting.  Skin: Negative for itching and rash.    All other systems negative unless noted above in HPI  Past medical/social/surgical/family history have been reviewed and are unchanged unless specifically indicated below.  No changes  Medication List: Allergies as of 01/17/2016   No Known Allergies     Medication List       Accurate as of 01/17/16  5:07 PM. Always use your most recent med list.           albuterol 108 (90 Base) MCG/ACT inhaler Commonly known as:  PROAIR HFA Inhale 2 puffs into the lungs every 4 (four) hours as needed for wheezing or shortness of breath. Reported on 02/23/2015   amLODipine-benazepril 10-20 MG capsule Commonly known as:  LOTREL Take 1 capsule by mouth daily.   aspirin EC 81 MG tablet Take 81 mg by mouth daily.   Beclomethasone Dipropionate 80 MCG/ACT Aers Commonly known as:  QNASL USE 1-2 PUFFS IN EACH NOSTRIL ONCE DAILY   esomeprazole 40 MG capsule Commonly known as:  NEXIUM Take 1 capsule (40 mg total) by mouth daily.   Fluticasone-Salmeterol 500-50 MCG/DOSE Aepb Commonly known as:  ADVAIR DISKUS Inhale 1 puff into the lungs 2 (two) times daily.   hydrochlorothiazide 25 MG tablet Commonly known as:  HYDRODIURIL Take 25 mg by mouth daily.   ibuprofen 800 MG tablet Commonly known as:  ADVIL,MOTRIN Take 800 mg by mouth every 8 (eight) hours as needed.   metFORMIN 1000 MG tablet Commonly known as:  GLUCOPHAGE 1,000 mg. Take two tablets once daily as directed.   montelukast 10 MG tablet Commonly known as:  SINGULAIR TAKE ONE TABLET BY MOUTH ONCE DAILY   zolpidem 12.5 MG CR tablet Commonly known as:  AMBIEN CR Take 12.5 mg by mouth at bedtime as needed for sleep.       Known medication allergies: No Known Allergies   Physical examination: Blood pressure 126/80, pulse 78, resp. rate 16.  General: Alert, interactive, in no acute distress. HEENT: TMs  pearly gray, turbinates mildly edematous without discharge, post-pharynx non erythematous. Neck: Supple without lymphadenopathy. Lungs: Mildly decreased breath sounds in the left apex otherwise clear without any wheezing throughout. {no increased work of breathing. Able to talk in complete sentences CV: Normal S1, S2 without murmurs. Abdomen: Nondistended, nontender. Skin: Warm and dry, without lesions or rashes. Extremities:  No clubbing, cyanosis or edema. Neuro:    Grossly intact.  Diagnositics/Labs: Spirometry: FEV1: 2.31L  68%, FVC: 2.86L  64%, ratio consistent with Moderate restrictive pattern. Spirometry is similar to previous study  Assessment and plan:   Severe persistent asthma with exacerbation  - Continue Advair 500 one inhalation twice a day  - Continue montelukast 10 mg one tablet one time per day  - Use ProAir HFA 2 puffs every 4-6 hours if needed.  Monitor frequency of use  - Continue Xolair and EpiPen   - Take prednisone 20mg  twice a day x 5 days  - Will obtain CXR.  If there is evidence of pneumonia we will call in an antibiotic  - May try Mucinex 1200mg  (may use twice a day) and drink with tall glass of water to help with cough and thin mucus   - If cough persist would be concerned Lotrel (ACEI) may be contributing to cough and would consider changing his blood pressure agent  Allergic rhinitis  - Fluticasone 1-2 puffs each nostril one time per day  - Continue Pazeo one drop each eye one time per day  - Can use over-the-counter nasal saline and antihistamine if needed  Reflux - Continue Nexium 40 mg twice a day   Return to clinic in 12 weeks or earlier if problem   I appreciate the opportunity to take part in Bobby Wheeler's care. Please do not hesitate to contact me with questions.  Sincerely,   Prudy Feeler, MD Allergy/Immunology [facility] [place]

## 2016-01-19 ENCOUNTER — Other Ambulatory Visit: Payer: Self-pay | Admitting: *Deleted

## 2016-01-19 DIAGNOSIS — K219 Gastro-esophageal reflux disease without esophagitis: Secondary | ICD-10-CM

## 2016-01-19 MED ORDER — ESOMEPRAZOLE MAGNESIUM 40 MG PO CPDR: 40.0000 mg | DELAYED_RELEASE_CAPSULE | Freq: Every day | ORAL | 5 refills | Status: DC

## 2016-01-25 ENCOUNTER — Encounter: Payer: Self-pay | Admitting: Allergy

## 2016-02-02 ENCOUNTER — Ambulatory Visit (INDEPENDENT_AMBULATORY_CARE_PROVIDER_SITE_OTHER): Payer: Medicaid Other

## 2016-02-02 DIAGNOSIS — J4540 Moderate persistent asthma, uncomplicated: Secondary | ICD-10-CM | POA: Diagnosis not present

## 2016-02-19 ENCOUNTER — Ambulatory Visit (INDEPENDENT_AMBULATORY_CARE_PROVIDER_SITE_OTHER): Payer: Medicaid Other | Admitting: *Deleted

## 2016-02-19 DIAGNOSIS — J4540 Moderate persistent asthma, uncomplicated: Secondary | ICD-10-CM

## 2016-02-20 ENCOUNTER — Other Ambulatory Visit: Payer: Self-pay | Admitting: Allergy and Immunology

## 2016-03-15 ENCOUNTER — Other Ambulatory Visit: Payer: Self-pay | Admitting: Allergy and Immunology

## 2016-05-22 ENCOUNTER — Ambulatory Visit (INDEPENDENT_AMBULATORY_CARE_PROVIDER_SITE_OTHER): Payer: Medicaid Other | Admitting: *Deleted

## 2016-05-22 DIAGNOSIS — J4540 Moderate persistent asthma, uncomplicated: Secondary | ICD-10-CM

## 2016-06-05 ENCOUNTER — Ambulatory Visit (INDEPENDENT_AMBULATORY_CARE_PROVIDER_SITE_OTHER): Payer: Medicaid Other | Admitting: *Deleted

## 2016-06-05 DIAGNOSIS — J4540 Moderate persistent asthma, uncomplicated: Secondary | ICD-10-CM

## 2016-06-19 ENCOUNTER — Ambulatory Visit (INDEPENDENT_AMBULATORY_CARE_PROVIDER_SITE_OTHER): Payer: Medicaid Other | Admitting: *Deleted

## 2016-06-19 DIAGNOSIS — J4540 Moderate persistent asthma, uncomplicated: Secondary | ICD-10-CM | POA: Diagnosis not present

## 2016-07-03 ENCOUNTER — Ambulatory Visit (INDEPENDENT_AMBULATORY_CARE_PROVIDER_SITE_OTHER): Payer: Medicaid Other | Admitting: *Deleted

## 2016-07-03 DIAGNOSIS — J4540 Moderate persistent asthma, uncomplicated: Secondary | ICD-10-CM | POA: Diagnosis not present

## 2016-07-18 ENCOUNTER — Ambulatory Visit (INDEPENDENT_AMBULATORY_CARE_PROVIDER_SITE_OTHER): Payer: Medicaid Other

## 2016-07-18 DIAGNOSIS — J4540 Moderate persistent asthma, uncomplicated: Secondary | ICD-10-CM | POA: Diagnosis not present

## 2016-07-26 ENCOUNTER — Other Ambulatory Visit: Payer: Self-pay | Admitting: Allergy and Immunology

## 2016-08-01 ENCOUNTER — Ambulatory Visit (INDEPENDENT_AMBULATORY_CARE_PROVIDER_SITE_OTHER): Payer: Medicaid Other | Admitting: *Deleted

## 2016-08-01 DIAGNOSIS — J4540 Moderate persistent asthma, uncomplicated: Secondary | ICD-10-CM

## 2016-08-14 ENCOUNTER — Other Ambulatory Visit: Payer: Self-pay | Admitting: *Deleted

## 2016-08-14 MED ORDER — STERILE WATER FOR INJECTION IJ SOLN: 10.0000 mL | INTRAMUSCULAR | 11 refills | Status: DC

## 2016-08-14 MED ORDER — OMALIZUMAB 150 MG ~~LOC~~ SOLR: 375.0000 mg | SUBCUTANEOUS | 11 refills | Status: DC

## 2016-08-15 ENCOUNTER — Ambulatory Visit (INDEPENDENT_AMBULATORY_CARE_PROVIDER_SITE_OTHER): Payer: Medicaid Other | Admitting: *Deleted

## 2016-08-15 DIAGNOSIS — J4540 Moderate persistent asthma, uncomplicated: Secondary | ICD-10-CM | POA: Diagnosis not present

## 2016-09-10 ENCOUNTER — Ambulatory Visit (INDEPENDENT_AMBULATORY_CARE_PROVIDER_SITE_OTHER): Payer: Medicaid Other | Admitting: *Deleted

## 2016-09-10 DIAGNOSIS — J4540 Moderate persistent asthma, uncomplicated: Secondary | ICD-10-CM | POA: Diagnosis not present

## 2016-09-25 ENCOUNTER — Ambulatory Visit (INDEPENDENT_AMBULATORY_CARE_PROVIDER_SITE_OTHER): Payer: Medicaid Other | Admitting: *Deleted

## 2016-09-25 DIAGNOSIS — J4540 Moderate persistent asthma, uncomplicated: Secondary | ICD-10-CM

## 2016-10-21 ENCOUNTER — Ambulatory Visit (INDEPENDENT_AMBULATORY_CARE_PROVIDER_SITE_OTHER): Payer: Medicaid Other | Admitting: *Deleted

## 2016-10-21 DIAGNOSIS — J4540 Moderate persistent asthma, uncomplicated: Secondary | ICD-10-CM | POA: Diagnosis not present

## 2016-12-18 ENCOUNTER — Other Ambulatory Visit: Payer: Self-pay | Admitting: Allergy and Immunology

## 2017-01-19 ENCOUNTER — Other Ambulatory Visit: Payer: Self-pay | Admitting: Allergy and Immunology

## 2017-01-19 DIAGNOSIS — K219 Gastro-esophageal reflux disease without esophagitis: Secondary | ICD-10-CM

## 2017-01-25 ENCOUNTER — Other Ambulatory Visit: Payer: Self-pay | Admitting: Allergy and Immunology

## 2017-01-25 DIAGNOSIS — K219 Gastro-esophageal reflux disease without esophagitis: Secondary | ICD-10-CM

## 2017-02-03 ENCOUNTER — Other Ambulatory Visit: Payer: Self-pay

## 2017-02-03 ENCOUNTER — Other Ambulatory Visit: Payer: Self-pay | Admitting: Allergy and Immunology

## 2017-02-03 DIAGNOSIS — K219 Gastro-esophageal reflux disease without esophagitis: Secondary | ICD-10-CM

## 2017-02-03 MED ORDER — ESOMEPRAZOLE MAGNESIUM 40 MG PO CPDR: 40.0000 mg | DELAYED_RELEASE_CAPSULE | Freq: Every day | ORAL | 1 refills | Status: DC

## 2017-02-03 NOTE — Telephone Encounter (Signed)
Bobby Wheeler needs a prescription refill sent to [facility] in [place], for [place].  Daughter states he usually gets a qty of 80.

## 2017-02-07 ENCOUNTER — Other Ambulatory Visit: Payer: Self-pay | Admitting: Allergy and Immunology

## 2017-02-10 ENCOUNTER — Telehealth: Payer: Self-pay | Admitting: Allergy and Immunology

## 2017-02-10 MED ORDER — MONTELUKAST SODIUM 10 MG PO TABS: 10.0000 mg | ORAL_TABLET | Freq: Every day | ORAL | 0 refills | Status: DC

## 2017-02-10 NOTE — Telephone Encounter (Signed)
Called patient and advised him needs appt. Not seen in one year.  appt made and one refill sent to [facility]

## 2017-02-10 NOTE — Telephone Encounter (Signed)
Bobby Wheeler called in and would like a refill sent to [facility] in [place] for singulair.

## 2017-02-27 ENCOUNTER — Encounter: Payer: Self-pay | Admitting: Allergy and Immunology

## 2017-02-27 ENCOUNTER — Ambulatory Visit (INDEPENDENT_AMBULATORY_CARE_PROVIDER_SITE_OTHER): Payer: Medicaid Other | Admitting: Allergy and Immunology

## 2017-02-27 VITALS — BP 130/78 | HR 72 | Resp 16

## 2017-02-27 DIAGNOSIS — J4550 Severe persistent asthma, uncomplicated: Secondary | ICD-10-CM | POA: Diagnosis not present

## 2017-02-27 DIAGNOSIS — J3089 Other allergic rhinitis: Secondary | ICD-10-CM

## 2017-02-27 DIAGNOSIS — K219 Gastro-esophageal reflux disease without esophagitis: Secondary | ICD-10-CM | POA: Diagnosis not present

## 2017-02-27 DIAGNOSIS — H1010 Acute atopic conjunctivitis, unspecified eye: Secondary | ICD-10-CM | POA: Diagnosis not present

## 2017-02-27 NOTE — Progress Notes (Signed)
Follow-up Note  Referring Provider: Delilah Shan, MD Primary Provider: Delilah Shan, MD Date of Office Visit: 02/27/2017  Subjective:   Bobby Wheeler (DOB: 12-27-1950) is a 67 y.o. male who returns to the [facility] on 02/27/2017 in re-evaluation of the following:  HPI: Bobby Wheeler presents to this clinic in evaluation of asthma and allergic rhinitis and reflux.  I last saw him in this clinic approximately 1 year ago.  Overall he has had a good year and has not required a systemic steroid or an antibiotic to treat a respiratory tract issue.  He rarely uses a short acting bronchodilator while continuing on Advair and montelukast.  He does not really exercise to any degree.  For the most part his nose has been doing well.  His eyes have been itchy since the pollen season has started.  His insurance company limited his Nexium to to once a day and he has been having problems with throat clearing and mucus stuck in his throat ever since he decreased that dose.  Allergies as of 02/27/2017   No Known Allergies     Medication List      ADVAIR DISKUS 500-50 MCG/DOSE Aepb Generic drug:  Fluticasone-Salmeterol INHALE ONE PUFF INTO THE LUNGS TWICE DAILY   amLODipine-benazepril 10-20 MG capsule Commonly known as:  LOTREL Take 1 capsule by mouth daily.   aspirin EC 81 MG tablet Take 81 mg by mouth daily.   empagliflozin 10 MG Tabs tablet Commonly known as:  JARDIANCE Take 1 daily.   esomeprazole 40 MG capsule Commonly known as:  NEXIUM Take 1 capsule (40 mg total) by mouth daily.   fluticasone 50 MCG/ACT nasal spray Commonly known as:  FLONASE Place 2 sprays into both nostrils daily.   hydrochlorothiazide 25 MG tablet Commonly known as:  HYDRODIURIL Take 25 mg by mouth daily.   metFORMIN 1000 MG tablet Commonly known as:  GLUCOPHAGE 1,000 mg. Take two tablets once daily as directed.   montelukast 10 MG tablet Commonly known as:  SINGULAIR Take 1 tablet  (10 mg total) by mouth daily.   MULTIVITAMIN ADULT PO Take by mouth daily.   PROAIR HFA 108 (90 Base) MCG/ACT inhaler Generic drug:  albuterol INHALE TWO PUFFS BY MOUTH EVERY 4 HOURS AS NEEDED FOR WHEEZING OR SHORTNESS OF BREATH   zolpidem 12.5 MG CR tablet Commonly known as:  AMBIEN CR Take 12.5 mg by mouth at bedtime as needed for sleep.       Past Medical History:  Diagnosis Date  . Asthma   . Diabetes ([place])   . Hypertension     Past Surgical History:  Procedure Laterality Date  . HERNIA REPAIR      Review of systems negative except as noted in HPI / PMHx or noted below:  Review of Systems  Constitutional: Negative.   HENT: Negative.   Eyes: Negative.   Respiratory: Negative.   Cardiovascular: Negative.   Gastrointestinal: Negative.   Genitourinary: Negative.   Musculoskeletal: Negative.   Skin: Negative.   Neurological: Negative.   Endo/Heme/Allergies: Negative.   Psychiatric/Behavioral: Negative.      Objective:   Vitals:   02/27/17 1522  BP: 130/78  Pulse: 72  Resp: 16          Physical Exam  Constitutional: He is well-developed, well-nourished, and in no distress.  HENT:  Head: Normocephalic.  Right Ear: Tympanic membrane, external ear and ear canal normal.  Left Ear: Tympanic membrane, external ear and  ear canal normal.  Nose: Nose normal. No mucosal edema or rhinorrhea.  Mouth/Throat: Uvula is midline and oropharynx is clear and moist. No oropharyngeal exudate.  Lateral tongue furrows   Eyes: Right conjunctiva is injected. Left conjunctiva is injected.  Neck: Trachea normal. No tracheal tenderness present. No tracheal deviation present. No thyromegaly present.  Cardiovascular: Normal rate, regular rhythm, S1 normal, S2 normal and normal heart sounds.  No murmur heard. Pulmonary/Chest: Breath sounds normal. No stridor. No respiratory distress. He has no wheezes. He has no rales.  Musculoskeletal: He exhibits no edema.  Lymphadenopathy:         Head (right side): No tonsillar adenopathy present.       Head (left side): No tonsillar adenopathy present.    He has no cervical adenopathy.  Neurological: He is alert. Gait normal.  Skin: No rash noted. He is not diaphoretic. No erythema. Nails show no clubbing.  Psychiatric: Mood and affect normal.    Diagnostics:    Spirometry was performed and demonstrated an FEV1 of 2.0 at 57 % of predicted.  The patient had an Asthma Control Test with the following results: ACT Total Score: 19.    Assessment and Plan:   1. Asthma, severe persistent, well-controlled   2. Other allergic rhinitis   3. Seasonal allergic conjunctivitis   4. LPRD (laryngopharyngeal reflux disease)     1. Continue Advair 500 one inhalation twice a day  2. Continue montelukast 10 mg one tablet one time per day  3. Continue Fluticasone 1-2 puffs each nostril one time per day  4. Continue [place]o one drop each eye one time per day  5. Continue generic Nexium 40mg  TWICE a day  6. Use ProAir HFA 2 puffs every 4-6 hours if needed  7. Can use over-the-counter nasal saline and antihistamine if needed  8. Return to clinic in 6 months or earlier if problem  I will continue to have Bobby Wheeler utilize a collection of anti-inflammatory agents for his respiratory tract as noted above and also continue to aggressively treat his reflux.  It is obvious that he needs to use Nexium twice a day rather than 1 time per day for he develops issues with his throat when utilizing this agent 1 time per day.  He has tongue furrows and I have asked him to follow-up with his primary care doctor regarding this issue as there may be a condition associated with a vitamin deficiency.  There does not appear to be any evidence that he has amyloidosis as his recent blood tests obtained collected in February 2019 appear to be pretty good without any evidence of organ dysfunction.  Allena Katz, MD Allergy / Immunology [place]

## 2017-02-27 NOTE — Patient Instructions (Addendum)
  1. Continue Advair 500 one inhalation twice a day  2. Continue montelukast 10 mg one tablet one time per day  3. Continue Fluticasone 1-2 puffs each nostril one time per day  4. Continue [place]azeo one drop each eye one time per day  5. Continue generic Nexium 40mg  TWICE a day  6. Use ProAir HFA 2 puffs every 4-6 hours if needed  7. Can use over-the-counter nasal saline and antihistamine if needed  8. Return to clinic in 6 months or earlier if problem

## 2017-03-03 ENCOUNTER — Encounter: Payer: Self-pay | Admitting: Allergy and Immunology

## 2017-04-21 ENCOUNTER — Other Ambulatory Visit: Payer: Self-pay | Admitting: Allergy and Immunology

## 2017-04-25 ENCOUNTER — Other Ambulatory Visit: Payer: Self-pay | Admitting: Allergy and Immunology

## 2017-04-25 DIAGNOSIS — K219 Gastro-esophageal reflux disease without esophagitis: Secondary | ICD-10-CM

## 2017-05-12 ENCOUNTER — Encounter: Payer: Self-pay | Admitting: Allergy and Immunology

## 2017-05-12 ENCOUNTER — Ambulatory Visit (INDEPENDENT_AMBULATORY_CARE_PROVIDER_SITE_OTHER): Payer: Medicaid Other | Admitting: Allergy and Immunology

## 2017-05-12 VITALS — BP 142/94 | HR 86 | Resp 20

## 2017-05-12 DIAGNOSIS — H1010 Acute atopic conjunctivitis, unspecified eye: Secondary | ICD-10-CM

## 2017-05-12 DIAGNOSIS — J3089 Other allergic rhinitis: Secondary | ICD-10-CM

## 2017-05-12 DIAGNOSIS — J4550 Severe persistent asthma, uncomplicated: Secondary | ICD-10-CM

## 2017-05-12 DIAGNOSIS — K219 Gastro-esophageal reflux disease without esophagitis: Secondary | ICD-10-CM

## 2017-05-12 MED ORDER — ESOMEPRAZOLE MAGNESIUM 40 MG PO CPDR: 40.0000 mg | DELAYED_RELEASE_CAPSULE | Freq: Two times a day (BID) | ORAL | 5 refills | Status: DC

## 2017-05-12 MED ORDER — ALBUTEROL SULFATE HFA 108 (90 BASE) MCG/ACT IN AERS: 1.0000 | INHALATION_SPRAY | Freq: Four times a day (QID) | RESPIRATORY_TRACT | 1 refills | Status: DC | PRN

## 2017-05-12 MED ORDER — METHYLPREDNISOLONE ACETATE 80 MG/ML IJ SUSP
80.0000 mg | Freq: Once | INTRAMUSCULAR | Status: AC
  Administered 2017-05-12: 80 mg via INTRAMUSCULAR

## 2017-05-12 MED ORDER — OLOPATADINE HCL 0.1 % OP SOLN: 1.0000 [drp] | Freq: Two times a day (BID) | OPHTHALMIC | 5 refills | Status: DC

## 2017-05-12 MED ORDER — METHYLPREDNISOLONE ACETATE 80 MG/ML IJ SUSP: 80.0000 mg | Freq: Once | INTRAMUSCULAR | Status: DC

## 2017-05-12 NOTE — Progress Notes (Signed)
Follow-up Note  Referring Provider: Delilah Shan, MD Primary Provider: Delilah Shan, MD Date of Office Visit: 05/12/2017  Subjective:   Bobby Wheeler (DOB: 20-Apr-1950) is a 67 y.o. male who returns to the [facility] on 05/12/2017 in re-evaluation of the following:  HPI: Mayan presents to this clinic in reevaluation of asthma and allergic rhinoconjunctivitis and reflux.  I last saw him in this clinic 27 February 2017.  He has done relatively well with his asthma since his last visit and has not required a systemic steroid or antibiotic to treat any type of respiratory tract issue and rarely uses a short acting bronchodilator while currently using his Advair mostly 1 time per day.  He has developed significant problems with sneezing and nasal congestion and itchy red watery eyes over the course of the past 3 to 4 weeks without any anosmia or ugly nasal discharge or fever or headaches.  This occurs even though he continues on montelukast and nasal fluticasone.  His reflux is under very good control at this point time as is his LPR while consistently using Nexium 40 mg twice a day.  Allergies as of 05/12/2017   No Known Allergies     Medication List      ADVAIR DISKUS 500-50 MCG/DOSE Aepb Generic drug:  Fluticasone-Salmeterol INHALE ONE PUFF INTO THE LUNGS TWICE DAILY   amLODipine-benazepril 10-20 MG capsule Commonly known as:  LOTREL Take 1 capsule by mouth daily.   aspirin EC 81 MG tablet Take 81 mg by mouth daily.   empagliflozin 10 MG Tabs tablet Commonly known as:  JARDIANCE Take 1 daily.   esomeprazole 40 MG capsule Commonly known as:  NEXIUM TAKE 1 CAPSULE BY MOUTH ONCE DAILY   fluticasone 50 MCG/ACT nasal spray Commonly known as:  FLONASE Place 2 sprays into both nostrils daily.   hydrochlorothiazide 25 MG tablet Commonly known as:  HYDRODIURIL Take 25 mg by mouth daily.   metFORMIN 1000 MG tablet Commonly known as:  GLUCOPHAGE 1,000  mg. Take two tablets once daily as directed.   montelukast 10 MG tablet Commonly known as:  SINGULAIR TAKE 1 TABLET BY MOUTH ONCE DAILY   MULTIVITAMIN ADULT PO Take by mouth daily.   PROAIR HFA 108 (90 Base) MCG/ACT inhaler Generic drug:  albuterol INHALE TWO PUFFS BY MOUTH EVERY 4 HOURS AS NEEDED FOR WHEEZING OR SHORTNESS OF BREATH   testosterone cypionate 200 MG/ML injection Commonly known as:  DEPOTESTOSTERONE CYPIONATE Inject 1 mL into the muscle every 14 (fourteen) days.   zolpidem 12.5 MG CR tablet Commonly known as:  AMBIEN CR Take 12.5 mg by mouth at bedtime as needed for sleep.       Past Medical History:  Diagnosis Date  . Asthma   . Diabetes ([place])   . Hypertension     Past Surgical History:  Procedure Laterality Date  . HERNIA REPAIR      Review of systems negative except as noted in HPI / PMHx or noted below:  Review of Systems  Constitutional: Negative.   HENT: Negative.   Eyes: Negative.   Respiratory: Negative.   Cardiovascular: Negative.   Gastrointestinal: Negative.   Genitourinary: Negative.   Musculoskeletal: Negative.   Skin: Negative.   Neurological: Negative.   Endo/Heme/Allergies: Negative.   Psychiatric/Behavioral: Negative.      Objective:   Vitals:   05/12/17 1542  BP: (!) 142/94  Pulse: 86  Resp: 20  Physical Exam  HENT:  Head: Normocephalic.  Right Ear: Tympanic membrane, external ear and ear canal normal.  Left Ear: Tympanic membrane, external ear and ear canal normal.  Nose: Mucosal edema present. No rhinorrhea.  Mouth/Throat: Uvula is midline, oropharynx is clear and moist and mucous membranes are normal. No oropharyngeal exudate.  Eyes: Right conjunctiva is injected. Left conjunctiva is injected.  Neck: Trachea normal. No tracheal tenderness present. No tracheal deviation present. No thyromegaly present.  Cardiovascular: Normal rate, regular rhythm, S1 normal, S2 normal and normal heart sounds.  No  murmur heard. Pulmonary/Chest: Breath sounds normal. No stridor. No respiratory distress. He has no wheezes. He has no rales.  Musculoskeletal: He exhibits no edema.  Lymphadenopathy:       Head (right side): No tonsillar adenopathy present.       Head (left side): No tonsillar adenopathy present.    He has no cervical adenopathy.  Neurological: He is alert.  Skin: No rash noted. He is not diaphoretic. No erythema. Nails show no clubbing.    Diagnostics:    Spirometry was performed and demonstrated an FEV1 of 2.08 at 60 % of predicted.  The patient had an Asthma Control Test with the following results: ACT Total Score: 13.    Assessment and Plan:   1. Asthma, severe persistent, well-controlled   2. Seasonal allergic conjunctivitis   3. Other allergic rhinitis   4. LPRD (laryngopharyngeal reflux disease)     1. Continue Advair 500 one inhalation 1-2 times a day being on disease activity  2. Continue montelukast 10 mg one tablet one time per day  3. Continue Fluticasone 1-2 puffs each nostril one time per day  4. Continue generic Nexium 40mg  TWICE a day  5. Can use the following if needed:   A.  Proventil HFA or similar  B.  Cetirizine 10 mg 1 tablet 1 time per day  C.  Patanol 1 drop each eye twice a day  6.  Depo-Medrol 80 IM delivered in clinic today  7. Return to clinic in 6 months or earlier if problem  Atharv appears to be doing well regarding his asthma but not so well regarding his allergic rhinoconjunctivitis as he goes through the spring time season and we will treat him with the therapy noted above which does include a systemic steroid.  He will continue on anti-inflammatory agents for his respiratory tract and also continue on therapy for reflux as noted above.  I will see him back in this clinic in 6 months or earlier if there is a problem.  Allena Katz, MD Allergy / Immunology [place]

## 2017-05-12 NOTE — Patient Instructions (Addendum)
  1. Continue Advair 500 one inhalation 1-2 times a day being on disease activity  2. Continue montelukast 10 mg one tablet one time per day  3. Continue Fluticasone 1-2 puffs each nostril one time per day  4. Continue generic Nexium 40mg  TWICE a day  5. Can use the following if needed:   A.  Proventil HFA or similar  B.  Cetirizine 10 mg 1 tablet 1 time per day  C.  Patanol 1 drop each eye twice a day  6.  Depo-Medrol 80 IM delivered in clinic today  7. Return to clinic in 6 months or earlier if problem

## 2017-05-13 ENCOUNTER — Other Ambulatory Visit: Payer: Self-pay | Admitting: *Deleted

## 2017-05-13 ENCOUNTER — Encounter: Payer: Self-pay | Admitting: Allergy and Immunology

## 2017-05-13 MED ORDER — PAZEO 0.7 % OP SOLN: 1.0000 [drp] | Freq: Every day | OPHTHALMIC | 5 refills | Status: DC | PRN

## 2017-12-21 ENCOUNTER — Other Ambulatory Visit: Payer: Self-pay | Admitting: Allergy and Immunology

## 2018-01-14 HISTORY — PX: CATARACT EXTRACTION: SUR2

## 2018-04-15 ENCOUNTER — Other Ambulatory Visit: Payer: Self-pay

## 2018-04-15 ENCOUNTER — Ambulatory Visit (INDEPENDENT_AMBULATORY_CARE_PROVIDER_SITE_OTHER): Payer: Medicare Other | Admitting: Allergy and Immunology

## 2018-04-15 ENCOUNTER — Encounter: Payer: Self-pay | Admitting: Allergy and Immunology

## 2018-04-15 VITALS — BP 128/72 | HR 100 | Temp 97.6°F | Resp 18

## 2018-04-15 DIAGNOSIS — H1010 Acute atopic conjunctivitis, unspecified eye: Secondary | ICD-10-CM | POA: Diagnosis not present

## 2018-04-15 DIAGNOSIS — K219 Gastro-esophageal reflux disease without esophagitis: Secondary | ICD-10-CM | POA: Diagnosis not present

## 2018-04-15 DIAGNOSIS — J4550 Severe persistent asthma, uncomplicated: Secondary | ICD-10-CM

## 2018-04-15 DIAGNOSIS — J3089 Other allergic rhinitis: Secondary | ICD-10-CM | POA: Diagnosis not present

## 2018-04-15 MED ORDER — METHYLPREDNISOLONE ACETATE 80 MG/ML IJ SUSP
80.0000 mg | Freq: Once | INTRAMUSCULAR | Status: AC
  Administered 2018-04-15: 80 mg via INTRAMUSCULAR

## 2018-04-15 MED ORDER — FLUTICASONE PROPIONATE 50 MCG/ACT NA SUSP: NASAL | 5 refills | Status: DC

## 2018-04-15 MED ORDER — ESOMEPRAZOLE MAGNESIUM 40 MG PO CPDR: 40.0000 mg | DELAYED_RELEASE_CAPSULE | Freq: Two times a day (BID) | ORAL | 5 refills | Status: DC

## 2018-04-15 MED ORDER — FLUTICASONE-SALMETEROL 500-50 MCG/DOSE IN AEPB: INHALATION_SPRAY | RESPIRATORY_TRACT | 5 refills | Status: DC

## 2018-04-15 MED ORDER — CETIRIZINE HCL 10 MG PO TABS: ORAL_TABLET | ORAL | 5 refills | Status: DC

## 2018-04-15 MED ORDER — ALBUTEROL SULFATE HFA 108 (90 BASE) MCG/ACT IN AERS: INHALATION_SPRAY | RESPIRATORY_TRACT | 1 refills | Status: DC

## 2018-04-15 MED ORDER — OLOPATADINE HCL 0.1 % OP SOLN: OPHTHALMIC | 5 refills | Status: DC

## 2018-04-15 MED ORDER — MONTELUKAST SODIUM 10 MG PO TABS: 10.0000 mg | ORAL_TABLET | Freq: Every day | ORAL | 5 refills | Status: DC

## 2018-04-15 NOTE — Progress Notes (Signed)
[facility] [place]   Follow-up Note  Referring Provider: Delilah Shan, MD Primary Provider: Delilah Shan, MD Date of Office Visit: 04/15/2018  Subjective:   Bobby Wheeler (DOB: 05/27/1950) is a 68 y.o. male who returns to the [facility] on 04/15/2018 in re-evaluation of the following:  HPI: Bobby Wheeler returns to this clinic in reevaluation of asthma and allergic rhinoconjunctivitis and reflux.  I last saw him in his clinic on 12 May 2017.  As is usually the case he does very well for the majority of the year while continuing to use a large collection of medical therapy directed against respiratory tract inflammation and using therapy directed against reflux.  He has not required a systemic steroid or an antibiotic to treat any type of respiratory tract issue since his last visit.  Rarely does use a short acting bronchodilator while he continues to use Advair mostly 1 time per day and montelukast on a consistent basis.  As is usually the case he is here today to address his springtime atopic flare which appears to occur just about every year for the past several years.  He has developed issues with itchy red watery eyes and nasal congestion and wheezing and coughing  Allergies as of 04/15/2018   No Known Allergies     Medication List      Advair Diskus 500-50 MCG/DOSE Aepb Generic drug:  Fluticasone-Salmeterol INHALE ONE PUFF INTO THE LUNGS TWICE DAILY   albuterol 108 (90 Base) MCG/ACT inhaler Commonly known as:  ProAir HFA Inhale 1-2 puffs into the lungs every 6 (six) hours as needed for wheezing or shortness of breath.   amLODipine-benazepril 10-20 MG capsule Commonly known as:  LOTREL Take 1 capsule by mouth daily.   aspirin EC 81 MG tablet Take 81 mg by mouth daily.   atorvastatin 20 MG tablet Commonly known as:  LIPITOR Take 20 mg by mouth daily.   cetirizine 10 MG tablet Commonly known as:  ZYRTEC Take by  mouth.   doxazosin 2 MG tablet Commonly known as:  CARDURA   empagliflozin 10 MG Tabs tablet Commonly known as:  JARDIANCE Take 1 daily.   esomeprazole 40 MG capsule Commonly known as:  NEXIUM Take 1 capsule (40 mg total) by mouth 2 (two) times daily before a meal.   fluticasone 50 MCG/ACT nasal spray Commonly known as:  FLONASE Place 2 sprays into both nostrils daily.   glipiZIDE 10 MG 24 hr tablet Commonly known as:  GLUCOTROL XL Take by mouth.   hydrochlorothiazide 25 MG tablet Commonly known as:  HYDRODIURIL Take 25 mg by mouth daily.   ibuprofen 800 MG tablet Commonly known as:  ADVIL,MOTRIN Take 800 mg by mouth every 8 (eight) hours as needed. for pain   Januvia 100 MG tablet Generic drug:  sitaGLIPtin   metFORMIN 1000 MG tablet Commonly known as:  GLUCOPHAGE 1,000 mg. Take two tablets once daily as directed.   montelukast 10 MG tablet Commonly known as:  SINGULAIR TAKE 1 TABLET BY MOUTH ONCE DAILY   moxifloxacin 0.5 % ophthalmic solution Commonly known as:  VIGAMOX PLACE ONE DROP INTO THE LEFT EYE FOUR TIMES DAILY START THE DAY AFTER SURGERY   MULTIVITAMIN ADULT PO Take by mouth daily.   Tadalafil 2.5 MG Tabs Take by mouth.   testosterone cypionate 200 MG/ML injection Commonly known as:  DEPOTESTOSTERONE CYPIONATE Inject 1 mL into the muscle every 14 (fourteen) days.   zolpidem  10 MG tablet Commonly known as:  AMBIEN       Past Medical History:  Diagnosis Date  . Asthma   . Diabetes ([place])   . Hypertension     Past Surgical History:  Procedure Laterality Date  . CATARACT EXTRACTION  2020  . HERNIA REPAIR      Review of systems negative except as noted in HPI / PMHx or noted below:  Review of Systems  Constitutional: Negative.   HENT: Negative.   Eyes: Negative.   Respiratory: Negative.   Cardiovascular: Negative.   Gastrointestinal: Negative.   Genitourinary: Negative.   Musculoskeletal: Negative.   Skin: Negative.    Neurological: Negative.   Endo/Heme/Allergies: Negative.   Psychiatric/Behavioral: Negative.      Objective:   Vitals:   04/15/18 1629  BP: 128/72  Pulse: 100  Resp: 18  Temp: 97.6 F (36.4 C)  SpO2: 94%          Physical Exam Constitutional:      Appearance: He is not diaphoretic.  HENT:     Head: Normocephalic.     Right Ear: Tympanic membrane, ear canal and external ear normal.     Left Ear: Tympanic membrane, ear canal and external ear normal.     Nose: Mucosal edema present. No rhinorrhea.     Mouth/Throat:     Pharynx: Uvula midline. No oropharyngeal exudate.  Eyes:     Conjunctiva/sclera:     Right eye: Right conjunctiva is injected.     Left eye: Left conjunctiva is injected.  Neck:     Thyroid: No thyromegaly.     Trachea: Trachea normal. No tracheal tenderness or tracheal deviation.  Cardiovascular:     Rate and Rhythm: Normal rate and regular rhythm.     Heart sounds: Normal heart sounds, S1 normal and S2 normal. No murmur.  Pulmonary:     Effort: No respiratory distress.     Breath sounds: No stridor. Wheezing (Bilateral expiratory wheezing posterior lung fields) present. No rales.  Lymphadenopathy:     Head:     Right side of head: No tonsillar adenopathy.     Left side of head: No tonsillar adenopathy.     Cervical: No cervical adenopathy.  Skin:    Findings: No erythema or rash.     Nails: There is no clubbing.   Neurological:     Mental Status: He is alert.     Diagnostics:    Spirometry was performed and demonstrated an FEV1 of 1.90 at 60 % of predicted.  Assessment and Plan:   1. Asthma, severe persistent, well-controlled   2. Seasonal allergic conjunctivitis   3. Other allergic rhinitis   4. LPRD (laryngopharyngeal reflux disease)     1. Continue Advair 500 one inhalation 1-2 times a day depending on disease activity  2. Continue montelukast 10 mg one tablet one time per day  3. Continue Fluticasone 1-2 puffs each nostril  one time per day  4. Continue generic Nexium 40mg  TWICE a day  5. Can use the following if needed:    A.  Proventil HFA or similar  B.  Cetirizine 10 mg 1 tablet 1 time per day  C.  Patanol 1 drop each eye twice a day  6.  Depo-Medrol 80 IM delivered in clinic today  7. Return to clinic in 6 months or earlier if problem  We will have Axxel utilize a combination of therapy that includes the administration of a systemic steroid today to help  him through his springtime atopic flare.  He will continue to utilize his chronic controller medications for both his upper and lower airways and also continue to treat his reflux as noted above.  Assuming he does well I will see him back in this clinic in 6 months or earlier if there is a problem.  Allena Katz, MD Allergy / Immunology [place]

## 2018-04-15 NOTE — Patient Instructions (Addendum)
  1. Continue Advair 500 one inhalation 1-2 times a day depending on disease activity  2. Continue montelukast 10 mg one tablet one time per day  3. Continue Fluticasone 1-2 puffs each nostril one time per day  4. Continue generic Nexium 40mg  TWICE a day  5. Can use the following if needed:    A.  Proventil HFA or similar  B.  Cetirizine 10 mg 1 tablet 1 time per day  C.  Patanol 1 drop each eye twice a day  6.  Depo-Medrol 80 IM delivered in clinic today  7. Return to clinic in 6 months or earlier if problem

## 2018-04-16 ENCOUNTER — Encounter: Payer: Self-pay | Admitting: Allergy and Immunology

## 2018-05-06 ENCOUNTER — Other Ambulatory Visit: Payer: Self-pay | Admitting: *Deleted

## 2018-05-06 MED ORDER — ESOMEPRAZOLE MAGNESIUM 40 MG PO CPDR: DELAYED_RELEASE_CAPSULE | ORAL | 1 refills | Status: DC

## 2018-07-07 ENCOUNTER — Other Ambulatory Visit: Payer: Self-pay | Admitting: *Deleted

## 2018-07-07 MED ORDER — FLUTICASONE-SALMETEROL 500-50 MCG/DOSE IN AEPB: INHALATION_SPRAY | RESPIRATORY_TRACT | 0 refills | Status: DC

## 2018-10-15 ENCOUNTER — Other Ambulatory Visit: Payer: Self-pay | Admitting: *Deleted

## 2018-11-13 ENCOUNTER — Other Ambulatory Visit: Payer: Self-pay | Admitting: Allergy and Immunology

## 2019-01-20 ENCOUNTER — Other Ambulatory Visit: Payer: Self-pay | Admitting: Allergy and Immunology

## 2019-01-31 ENCOUNTER — Other Ambulatory Visit: Payer: Self-pay | Admitting: Allergy and Immunology

## 2019-02-04 ENCOUNTER — Telehealth: Payer: Self-pay | Admitting: Allergy and Immunology

## 2019-02-04 MED ORDER — MONTELUKAST SODIUM 10 MG PO TABS: 10.0000 mg | ORAL_TABLET | Freq: Every day | ORAL | 0 refills | Status: DC

## 2019-02-04 NOTE — Telephone Encounter (Signed)
Montelukast to [facility] in [place].

## 2019-02-04 NOTE — Telephone Encounter (Signed)
Patient made apt for 2/3. Aware he has to keep apt for further refills.

## 2019-02-17 ENCOUNTER — Encounter: Payer: Self-pay | Admitting: Allergy and Immunology

## 2019-02-17 ENCOUNTER — Ambulatory Visit (INDEPENDENT_AMBULATORY_CARE_PROVIDER_SITE_OTHER): Payer: Medicare Other | Admitting: Allergy and Immunology

## 2019-02-17 ENCOUNTER — Other Ambulatory Visit: Payer: Self-pay

## 2019-02-17 VITALS — BP 92/58 | HR 86 | Temp 97.7°F | Resp 16

## 2019-02-17 DIAGNOSIS — H1010 Acute atopic conjunctivitis, unspecified eye: Secondary | ICD-10-CM

## 2019-02-17 DIAGNOSIS — J3089 Other allergic rhinitis: Secondary | ICD-10-CM | POA: Diagnosis not present

## 2019-02-17 DIAGNOSIS — J301 Allergic rhinitis due to pollen: Secondary | ICD-10-CM

## 2019-02-17 DIAGNOSIS — J4550 Severe persistent asthma, uncomplicated: Secondary | ICD-10-CM

## 2019-02-17 DIAGNOSIS — K219 Gastro-esophageal reflux disease without esophagitis: Secondary | ICD-10-CM

## 2019-02-17 MED ORDER — FLUTICASONE-SALMETEROL 500-50 MCG/DOSE IN AEPB: INHALATION_SPRAY | RESPIRATORY_TRACT | 1 refills | Status: DC

## 2019-02-17 MED ORDER — ALBUTEROL SULFATE HFA 108 (90 BASE) MCG/ACT IN AERS: INHALATION_SPRAY | RESPIRATORY_TRACT | 1 refills | Status: DC

## 2019-02-17 MED ORDER — MONTELUKAST SODIUM 10 MG PO TABS: 10.0000 mg | ORAL_TABLET | Freq: Every day | ORAL | 1 refills | Status: DC

## 2019-02-17 MED ORDER — ESOMEPRAZOLE MAGNESIUM 40 MG PO CPDR: DELAYED_RELEASE_CAPSULE | ORAL | 1 refills | Status: DC

## 2019-02-17 MED ORDER — CETIRIZINE HCL 10 MG PO TABS: ORAL_TABLET | ORAL | 1 refills | Status: DC

## 2019-02-17 MED ORDER — OLOPATADINE HCL 0.1 % OP SOLN: OPHTHALMIC | 1 refills | Status: DC

## 2019-02-17 MED ORDER — FLUTICASONE PROPIONATE 50 MCG/ACT NA SUSP: NASAL | 1 refills | Status: DC

## 2019-02-17 NOTE — Patient Instructions (Signed)
  1. Continue Advair 500 one inhalation 1-2 times a day depending on disease activity  2. Continue montelukast 10 mg one tablet one time per day  3. Continue Fluticasone 1-2 puffs each nostril one time per day  4. Continue generic Nexium 40mg  1-2 times per day  5. Can use the following if needed:    A.  Proventil HFA or similar  B.  Cetirizine 10 mg 1 tablet 1 time per day  C.  Patanol 1 drop each eye twice a day  6.  Obtain COVID vaccine  7. Return to clinic in 6 months or earlier if problem

## 2019-02-17 NOTE — Progress Notes (Signed)
[facility] - [place] [facility] [place]   Follow-up Note  Referring Provider: Delilah Shan, MD Primary Provider: Bonnita Nasuti, MD Date of Office Visit: 02/17/2019  Subjective:   Bobby Wheeler (DOB: [birth year]) is a 69 y.o. male who returns to the [facility] [place] on 02/17/2019 in re-evaluation of the following:  HPI: Bobby Wheeler presents to this clinic in reevaluation of his severe asthma and allergic rhinoconjunctivitis and reflux.  His last visit to this clinic was 15 April 2018.  He has really had a relatively good year regarding control of his asthma without the need for systemic steroid to treat an exacerbation and rare use of the short acting bronchodilator while he uses Advair mostly 1 time per day.  Likewise, his nose has been doing very well while using montelukast consistently and occasional nasal steroid.  His reflux is under very good control at this point while using Nexium twice a day.  He did obtain the flu vaccine but has not obtained the Covid vaccine.  He will be spending 5 weeks in [place] starting 07 March 2019.  Allergies as of 02/17/2019   No Known Allergies     Medication List    albuterol 108 (90 Base) MCG/ACT inhaler Commonly known as: ProAir HFA Can use two puffs every four to six hours as needed for cough or wheeze.   amLODipine-benazepril 10-20 MG capsule Commonly known as: LOTREL Take 1 capsule by mouth daily.   aspirin EC 81 MG tablet Take 81 mg by mouth daily.   atorvastatin 20 MG tablet Commonly known as: LIPITOR Take 20 mg by mouth daily.   cetirizine 10 MG tablet Commonly known as: ZYRTEC Can take one tablet by mouth once daily if needed.   doxazosin 2 MG tablet Commonly known as: CARDURA   esomeprazole 40 MG capsule Commonly known as: NEXIUM Take one capsule twice daily as directed   Farxiga 10 MG Tabs tablet Generic drug: dapagliflozin propanediol Take 10 mg by mouth daily.   fluticasone 50  MCG/ACT nasal spray Commonly known as: FLONASE Use one to two sprays in each nostril once daily as directed.   Fluticasone-Salmeterol 500-50 MCG/DOSE Aepb Commonly known as: Advair Diskus Inhale one dose twice daily to prevent cough or wheeze.  Rinse, gargle, and spit after use.   glipiZIDE 10 MG 24 hr tablet Commonly known as: GLUCOTROL XL Take by mouth.   hydrochlorothiazide 25 MG tablet Commonly known as: HYDRODIURIL Take 25 mg by mouth daily.   ibuprofen 800 MG tablet Commonly known as: ADVIL Take 800 mg by mouth every 8 (eight) hours as needed. for pain   metFORMIN 1000 MG tablet Commonly known as: GLUCOPHAGE 1,000 mg. Take two tablets once daily as directed.   montelukast 10 MG tablet Commonly known as: SINGULAIR Take 1 tablet (10 mg total) by mouth daily.   moxifloxacin 0.5 % ophthalmic solution Commonly known as: VIGAMOX PLACE ONE DROP INTO THE LEFT EYE FOUR TIMES DAILY START THE DAY AFTER SURGERY   MULTIVITAMIN ADULT PO Take by mouth daily.   olopatadine 0.1 % ophthalmic solution Commonly known as: PATANOL Can use one drop in each eye one to two times daily if needed for itchy eyes.   Tadalafil 2.5 MG Tabs Take by mouth.   tamsulosin 0.4 MG Caps capsule Commonly known as: FLOMAX Take 0.4 mg by mouth daily.   zolpidem 10 MG tablet Commonly known as: AMBIEN       Past Medical History:  Diagnosis Date  . Asthma   . Diabetes ([place])   . Hypertension     Past Surgical History:  Procedure Laterality Date  . CATARACT EXTRACTION  2020  . HERNIA REPAIR      Review of systems negative except as noted in HPI / PMHx or noted below:  Review of Systems  Constitutional: Negative.   HENT: Negative.   Eyes: Negative.   Respiratory: Negative.   Cardiovascular: Negative.   Gastrointestinal: Negative.   Genitourinary: Negative.   Musculoskeletal: Negative.   Skin: Negative.   Neurological: Negative.   Endo/Heme/Allergies: Negative.     Psychiatric/Behavioral: Negative.      Objective:   Vitals:   02/17/19 1616  BP: (!) 92/58  Pulse: 86  Resp: 16  Temp: 97.7 F (36.5 C)  SpO2: 96%          Physical Exam Constitutional:      Appearance: He is not diaphoretic.  HENT:     Head: Normocephalic.     Right Ear: Tympanic membrane, ear canal and external ear normal.     Left Ear: Tympanic membrane, ear canal and external ear normal.     Nose: Nose normal. No mucosal edema or rhinorrhea.     Mouth/Throat:     Pharynx: Uvula midline. No oropharyngeal exudate.  Eyes:     Conjunctiva/sclera: Conjunctivae normal.  Neck:     Thyroid: No thyromegaly.     Trachea: Trachea normal. No tracheal tenderness or tracheal deviation.  Cardiovascular:     Rate and Rhythm: Normal rate and regular rhythm.     Heart sounds: Normal heart sounds, S1 normal and S2 normal. No murmur.  Pulmonary:     Effort: No respiratory distress.     Breath sounds: Normal breath sounds. No stridor. No wheezing or rales.  Lymphadenopathy:     Head:     Right side of head: No tonsillar adenopathy.     Left side of head: No tonsillar adenopathy.     Cervical: No cervical adenopathy.  Skin:    Findings: No erythema or rash.     Nails: There is no clubbing.  Neurological:     Mental Status: He is alert.     Diagnostics:    Spirometry was performed and demonstrated an FEV1 of 2.33 at 77 % of predicted.  The patient had an Asthma Control Test with the following results:  .    Assessment and Plan:   1. Asthma, severe persistent, well-controlled   2. Perennial allergic rhinitis   3. Seasonal allergic rhinitis due to pollen   4. Seasonal allergic conjunctivitis   5. LPRD (laryngopharyngeal reflux disease)     1. Continue Advair 500 one inhalation 1-2 times a day depending on disease activity  2. Continue montelukast 10 mg one tablet one time per day  3. Continue Fluticasone 1-2 puffs each nostril one time per day  4. Continue  generic Nexium 40mg  1-2 times per day  5. Can use the following if needed:    A.  Proventil HFA or similar  B.  Cetirizine 10 mg 1 tablet 1 time per day  C.  Patanol 1 drop each eye twice a day  6.  Obtain COVID vaccine  7. Return to clinic in 6 months or earlier if problem  Cletis is really doing very well at this point in time on his current plan of action which includes a collection of anti-inflammatory agents for his airway and therapy directed against reflux.  He  will continue on this plan and I will see him back in this clinic in 6 months or earlier if there is a problem.  Allena Katz, MD Allergy / Immunology [place]

## 2019-02-18 ENCOUNTER — Encounter: Payer: Self-pay | Admitting: Allergy and Immunology

## 2019-02-22 ENCOUNTER — Telehealth: Payer: Self-pay | Admitting: *Deleted

## 2019-02-22 ENCOUNTER — Other Ambulatory Visit: Payer: Self-pay | Admitting: *Deleted

## 2019-02-22 MED ORDER — OLOPATADINE HCL 0.2 % OP SOLN: 1.0000 [drp] | Freq: Every day | OPHTHALMIC | 5 refills | Status: DC

## 2019-02-22 NOTE — Telephone Encounter (Signed)
Patanol is not covered by the patient's insurance. Generic Pataday and Cromolyn may be preferred. Please advise change in medication.

## 2019-02-22 NOTE — Telephone Encounter (Signed)
Pataday 1 drop 1 time per day

## 2019-02-22 NOTE — Telephone Encounter (Signed)
Sent prescription in for Pataday 1 drop in both eyes daily.

## 2019-02-23 ENCOUNTER — Other Ambulatory Visit: Payer: Self-pay | Admitting: Allergy and Immunology

## 2019-03-30 ENCOUNTER — Other Ambulatory Visit: Payer: Self-pay | Admitting: *Deleted

## 2019-03-30 MED ORDER — OLOPATADINE HCL 0.2 % OP SOLN: OPHTHALMIC | 5 refills | Status: DC

## 2019-04-15 ENCOUNTER — Encounter: Payer: Self-pay | Admitting: Allergy and Immunology

## 2019-04-15 ENCOUNTER — Other Ambulatory Visit: Payer: Self-pay

## 2019-04-15 ENCOUNTER — Ambulatory Visit (INDEPENDENT_AMBULATORY_CARE_PROVIDER_SITE_OTHER): Payer: Medicare Other | Admitting: Allergy and Immunology

## 2019-04-15 VITALS — BP 114/66 | HR 100 | Temp 97.6°F | Resp 22 | Ht 69.0 in | Wt 196.2 lb

## 2019-04-15 DIAGNOSIS — K219 Gastro-esophageal reflux disease without esophagitis: Secondary | ICD-10-CM

## 2019-04-15 DIAGNOSIS — J301 Allergic rhinitis due to pollen: Secondary | ICD-10-CM

## 2019-04-15 DIAGNOSIS — J3089 Other allergic rhinitis: Secondary | ICD-10-CM

## 2019-04-15 DIAGNOSIS — J4551 Severe persistent asthma with (acute) exacerbation: Secondary | ICD-10-CM

## 2019-04-15 DIAGNOSIS — B370 Candidal stomatitis: Secondary | ICD-10-CM

## 2019-04-15 DIAGNOSIS — H1010 Acute atopic conjunctivitis, unspecified eye: Secondary | ICD-10-CM

## 2019-04-15 MED ORDER — FLUTICASONE PROPIONATE 50 MCG/ACT NA SUSP: NASAL | 1 refills | Status: DC

## 2019-04-15 MED ORDER — FLUCONAZOLE 150 MG PO TABS: ORAL_TABLET | ORAL | 0 refills | Status: DC

## 2019-04-15 MED ORDER — MONTELUKAST SODIUM 10 MG PO TABS: 10.0000 mg | ORAL_TABLET | Freq: Every day | ORAL | 1 refills | Status: DC

## 2019-04-15 MED ORDER — ALBUTEROL SULFATE HFA 108 (90 BASE) MCG/ACT IN AERS: INHALATION_SPRAY | RESPIRATORY_TRACT | 1 refills | Status: DC

## 2019-04-15 MED ORDER — OLOPATADINE HCL 0.2 % OP SOLN: OPHTHALMIC | 5 refills | Status: DC

## 2019-04-15 MED ORDER — ESOMEPRAZOLE MAGNESIUM 40 MG PO CPDR: DELAYED_RELEASE_CAPSULE | ORAL | 1 refills | Status: DC

## 2019-04-15 MED ORDER — BREZTRI AEROSPHERE 160-9-4.8 MCG/ACT IN AERO: 2.0000 | INHALATION_SPRAY | Freq: Two times a day (BID) | RESPIRATORY_TRACT | 5 refills | Status: DC

## 2019-04-15 NOTE — Patient Instructions (Addendum)
  1. Start Breztri - 2 inhalations 2 times per day with spacer (replaces Advair)  2. Continue montelukast 10 mg one tablet one time per day  3. Continue Fluticasone 1-2 puffs each nostril one time per day  4. Increase generic Nexium 40mg  2 times per day  5. Start Diflucan 150 - single dose today  5. Can use the following if needed:    A.  Proventil HFA or similar  B.  Cetirizine 10 mg 1 tablet 1 time per day  C.  Pataday 1 drop each eye 1 time per day  6.  Obtain COVID vaccine - [phone]  7. Try to avoid steroid use around the time of Covid vacination  8. Return to clinic in 6 months or earlier if problem

## 2019-04-15 NOTE — Progress Notes (Signed)
[place] [facility] - [place] [facility] [place]   Follow-up Note  Referring Provider: Bonnita Nasuti, MD Primary Provider: Bonnita Nasuti, MD Date of Office Visit: 04/15/2019  Subjective:   Bobby Wheeler (DOB: 04-24-1950) is a 69 y.o. male who returns to the [facility] and [place] on 04/15/2019 in re-evaluation of the following:  HPI: Adan returns to this clinic in evaluation of severe asthma and allergic rhinoconjunctivitis and reflux.  His last visit to this clinic was 17 February 2019.  He was doing very well since his last visit but when he was visiting [place] recently he did have a fair amount of pollen exposure and developed issues with itchy eyes and runny nose and coughing for whichshe went to a local physician in [place] and was treated with a steroid injection and azithromycin for 5 days.  He has improved significantly with his symptoms yet still continues to have a little bit of a cough.  As well, over the course of the past week or so he has developed 2 episodes of laryngeal spasm.  He has developed that choking while drinking and eating and goes into an unrelenting coughing spell and cannot really speak.  This may last up to a minute.  His reflux is under very good control.  He is only using his Nexium 1 time per day at this point.  His sister, who had diabetes and was on dialysis, passed away from sudden death within the past 2 weeks.  Allergies as of 04/15/2019   No Known Allergies     Medication List    albuterol 108 (90 Base) MCG/ACT inhaler Commonly known as: ProAir HFA Can use two puffs every four to six hours as needed for cough or wheeze.   amLODipine-benazepril 10-20 MG capsule Commonly known as: LOTREL Take 1 capsule by mouth daily.   aspirin EC 81 MG tablet Take 81 mg by mouth daily.   atorvastatin 20 MG tablet Commonly known as: LIPITOR Take 20 mg by mouth daily.   cetirizine 10 MG tablet Commonly known as: ZYRTEC Can take one  tablet by mouth once daily if needed.   esomeprazole 40 MG capsule Commonly known as: NEXIUM Take one capsule twice daily as directed   Farxiga 10 MG Tabs tablet Generic drug: dapagliflozin propanediol Take 10 mg by mouth daily.   fluticasone 50 MCG/ACT nasal spray Commonly known as: FLONASE Use one to two sprays in each nostril once daily as directed.   Fluticasone-Salmeterol 500-50 MCG/DOSE Aepb Commonly known as: Advair Diskus Inhale one dose twice daily to prevent cough or wheeze.  Rinse, gargle, and spit after use.   hydrochlorothiazide 25 MG tablet Commonly known as: HYDRODIURIL Take 25 mg by mouth daily.   ibuprofen 800 MG tablet Commonly known as: ADVIL Take 800 mg by mouth every 8 (eight) hours as needed. for pain   metFORMIN 1000 MG tablet Commonly known as: GLUCOPHAGE 1,000 mg. Take two tablets once daily as directed.   montelukast 10 MG tablet Commonly known as: SINGULAIR Take 1 tablet (10 mg total) by mouth daily.   MULTIVITAMIN ADULT PO Take by mouth daily.   Olopatadine HCl 0.2 % Soln Commonly known as: Pataday Use one drop in each eye once daily if needed   Tadalafil 2.5 MG Tabs Take by mouth.   tamsulosin 0.4 MG Caps capsule Commonly known as: FLOMAX Take 0.4 mg by mouth daily.   zolpidem 10 MG tablet Commonly known as: AMBIEN  Past Medical History:  Diagnosis Date  . Asthma   . Diabetes ([place])   . Hypertension   . LPRD (laryngopharyngeal reflux disease)     Past Surgical History:  Procedure Laterality Date  . CATARACT EXTRACTION  2020  . HERNIA REPAIR      Review of systems negative except as noted in HPI / PMHx or noted below:  Review of Systems  Constitutional: Negative.   HENT: Negative.   Eyes: Negative.   Respiratory: Negative.   Cardiovascular: Negative.   Gastrointestinal: Negative.   Genitourinary: Negative.   Musculoskeletal: Negative.   Skin: Negative.   Neurological: Negative.   Endo/Heme/Allergies:  Negative.   Psychiatric/Behavioral: Negative.      Objective:   Vitals:   04/15/19 1613  BP: 114/66  Pulse: 100  Resp: (!) 22  Temp: 97.6 F (36.4 C)  SpO2: 96%   Height: 5\' 9"  (175.3 cm)  Weight: 196 lb 3.2 oz (89 kg)   Physical Exam Constitutional:      Appearance: He is not diaphoretic.  HENT:     Head: Normocephalic.     Right Ear: Tympanic membrane, ear canal and external ear normal.     Left Ear: Tympanic membrane, ear canal and external ear normal.     Nose: Nose normal. No mucosal edema or rhinorrhea.     Mouth/Throat:     Pharynx: Uvula midline. Oropharyngeal exudate (Thrush) present.  Eyes:     Conjunctiva/sclera: Conjunctivae normal.  Neck:     Thyroid: No thyromegaly.     Trachea: Trachea normal. No tracheal tenderness or tracheal deviation.  Cardiovascular:     Rate and Rhythm: Normal rate and regular rhythm.     Heart sounds: Normal heart sounds, S1 normal and S2 normal. No murmur.  Pulmonary:     Effort: No respiratory distress.     Breath sounds: Normal breath sounds. No stridor. No wheezing or rales.  Lymphadenopathy:     Head:     Right side of head: No tonsillar adenopathy.     Left side of head: No tonsillar adenopathy.     Cervical: No cervical adenopathy.  Skin:    Findings: No erythema or rash.     Nails: There is no clubbing.  Neurological:     Mental Status: He is alert.     Diagnostics:    Spirometry was performed and demonstrated an FEV1 of 1.70 at 52 % of predicted.  Assessment and Plan:   1. Asthma, not well controlled, severe persistent, with acute exacerbation   2. Perennial allergic rhinitis   3. Seasonal allergic rhinitis due to pollen   4. Seasonal allergic conjunctivitis   5. LPRD (laryngopharyngeal reflux disease)   6. Thrush     1. Start Breztri - 2 inhalations 2 times per day with spacer (replaces Advair)  2. Continue montelukast 10 mg one tablet one time per day  3. Continue Fluticasone 1-2 puffs each  nostril one time per day  4. Increase generic Nexium 40mg  2 times per day  5. Start Diflucan 150 - single dose today  5. Can use the following if needed:    A.  Proventil HFA or similar  B.  Cetirizine 10 mg 1 tablet 1 time per day  C.  Pataday 1 drop each eye 1 time per day  6.  Obtain COVID vaccine - [phone]  7. Try to avoid steroid use around the time of Covid vacination  8. Return to clinic in 6 months or earlier  if problem  Jarred appears to have had a exacerbation of his atopic respiratory and conjunctival disease as a result of pollen exposure while living in [place] and was treated with a systemic steroid.  Although the systemic steroid did help him he still shows some evidence of inflammation during today's visit and he also shows evidence of significant thrush which may be the reason why he has had some of his choking spells and laryngeal spasm.  We will treat him with a triple combination inhaler to address his inflammatory component of his airway problem and we will treat him with Diflucan with a single dose to address his thrush.  I would like for him to get the Covid vaccine as soon as possible and I informed him that he should try to avoid systemic steroid use around the time of his Covid vaccination.  He can call the health department on Monday to set up his appointment for that vaccination.  Assuming he does well with this plan I will see him back in this clinic in 6 months or earlier if there is a problem.  Allena Katz, MD Allergy / Immunology [place]

## 2019-04-19 ENCOUNTER — Encounter: Payer: Self-pay | Admitting: Allergy and Immunology

## 2019-07-26 ENCOUNTER — Ambulatory Visit (INDEPENDENT_AMBULATORY_CARE_PROVIDER_SITE_OTHER): Payer: Medicare Other | Admitting: Allergy and Immunology

## 2019-07-26 ENCOUNTER — Encounter: Payer: Self-pay | Admitting: Allergy and Immunology

## 2019-07-26 ENCOUNTER — Telehealth: Payer: Self-pay

## 2019-07-26 ENCOUNTER — Other Ambulatory Visit: Payer: Self-pay

## 2019-07-26 VITALS — BP 136/78 | HR 80 | Resp 20

## 2019-07-26 DIAGNOSIS — L729 Follicular cyst of the skin and subcutaneous tissue, unspecified: Secondary | ICD-10-CM

## 2019-07-26 DIAGNOSIS — J3089 Other allergic rhinitis: Secondary | ICD-10-CM

## 2019-07-26 DIAGNOSIS — B370 Candidal stomatitis: Secondary | ICD-10-CM

## 2019-07-26 DIAGNOSIS — K219 Gastro-esophageal reflux disease without esophagitis: Secondary | ICD-10-CM | POA: Diagnosis not present

## 2019-07-26 DIAGNOSIS — J4550 Severe persistent asthma, uncomplicated: Secondary | ICD-10-CM

## 2019-07-26 MED ORDER — MONTELUKAST SODIUM 10 MG PO TABS: 10.0000 mg | ORAL_TABLET | Freq: Every day | ORAL | 1 refills | Status: DC

## 2019-07-26 MED ORDER — FAMOTIDINE 40 MG PO TABS: 40.0000 mg | ORAL_TABLET | Freq: Every day | ORAL | 5 refills | Status: DC

## 2019-07-26 MED ORDER — FLUCONAZOLE 150 MG PO TABS: ORAL_TABLET | ORAL | 0 refills | Status: DC

## 2019-07-26 NOTE — Progress Notes (Signed)
[facility] - [place] [facility] [place]   Follow-up Note  Referring Provider: Bonnita Nasuti, MD Primary Provider: Bonnita Nasuti, MD Date of Office Visit: 07/26/2019  Subjective:   Bobby Wheeler (DOB: 1950/06/04) is a 69 y.o. male who returns to the [facility] and [place] [facility] on 07/26/2019 in re-evaluation of the following:  HPI: Ercole returns to this clinic in evaluation of severe asthma and allergic rhinoconjunctivitis and reflux.  I last saw him in this clinic on 15 April 2019  During his last visit he was having an issue with choking when drinking and eating and then going into an unrelenting coughing spell.  He appeared to have some evidence of thrush and we gave him a Diflucan tablet and that actually helped that issue.  However, he is left with a dry cough and some throat clearing.  He does have heartburn up into his throat on a common basis even though he uses Nexium twice a day.  He believes that his asthma is under very good control while using his triple inhaler.  Rarely does he use a short acting bronchodilator.  He does use a spacing device with that inhaler.  In addition, he complains of having a red area on his lower abdomen that appears to fill up with fluid and gets red and then drains recurrently.  He has obtain 2 Pfizer Covid vaccinations.   Allergies as of 07/26/2019   No Known Allergies     Medication List    albuterol 108 (90 Base) MCG/ACT inhaler Commonly known as: ProAir HFA Can use two puffs every four to six hours as needed for cough or wheeze.   amLODipine-benazepril 10-20 MG capsule Commonly known as: LOTREL Take 1 capsule by mouth daily.   aspirin EC 81 MG tablet Take 81 mg by mouth daily.   atorvastatin 40 MG tablet Commonly known as: LIPITOR Take 40 mg by mouth daily.   Breztri Aerosphere 160-9-4.8 MCG/ACT Aero Generic drug: Budeson-Glycopyrrol-Formoterol Inhale 2 puffs into the lungs in the morning and at bedtime. Use  with spacer.  Rinse, gargle, and spit after use.   cetirizine 10 MG tablet Commonly known as: ZYRTEC Can take one tablet by mouth once daily if needed.   esomeprazole 40 MG capsule Commonly known as: NEXIUM Take one capsule twice daily as directed   fluticasone 50 MCG/ACT nasal spray Commonly known as: FLONASE Use one to two sprays in each nostril once daily as directed.   hydrochlorothiazide 25 MG tablet Commonly known as: HYDRODIURIL Take 25 mg by mouth daily.   ibuprofen 800 MG tablet Commonly known as: ADVIL Take 800 mg by mouth every 8 (eight) hours as needed. for pain   Jardiance 10 MG Tabs tablet Generic drug: empagliflozin Take 10 mg by mouth daily.   metFORMIN 1000 MG tablet Commonly known as: GLUCOPHAGE 1,000 mg. Take two tablets once daily as directed.   montelukast 10 MG tablet Commonly known as: SINGULAIR Take 1 tablet (10 mg total) by mouth daily.   MULTIVITAMIN ADULT PO Take by mouth daily.   Olopatadine HCl 0.2 % Soln Commonly known as: Pataday Use one drop in each eye once daily if needed   Tadalafil 2.5 MG Tabs Take by mouth.   zolpidem 10 MG tablet Commonly known as: AMBIEN       Past Medical History:  Diagnosis Date  . Asthma   . Diabetes ([place])   . Hypertension   . LPRD (laryngopharyngeal reflux disease)  Past Surgical History:  Procedure Laterality Date  . CATARACT EXTRACTION  2020  . HERNIA REPAIR      Review of systems negative except as noted in HPI / PMHx or noted below:  Review of Systems  Constitutional: Negative.   HENT: Negative.   Eyes: Negative.   Respiratory: Negative.   Cardiovascular: Negative.   Gastrointestinal: Negative.   Genitourinary: Negative.   Musculoskeletal: Negative.   Skin: Negative.   Neurological: Negative.   Endo/Heme/Allergies: Negative.   Psychiatric/Behavioral: Negative.      Objective:   Vitals:   07/26/19 1046  BP: 136/78  Pulse: 80  Resp: 20  SpO2: 97%           Physical Exam Constitutional:      Appearance: He is not diaphoretic.  HENT:     Head: Normocephalic.     Right Ear: Tympanic membrane, ear canal and external ear normal.     Left Ear: Tympanic membrane, ear canal and external ear normal.     Nose: Nose normal. No mucosal edema or rhinorrhea.     Mouth/Throat:     Pharynx: Uvula midline. No oropharyngeal exudate.  Eyes:     Conjunctiva/sclera: Conjunctivae normal.  Neck:     Thyroid: No thyromegaly.     Trachea: Trachea normal. No tracheal tenderness or tracheal deviation.  Cardiovascular:     Rate and Rhythm: Normal rate and regular rhythm.     Heart sounds: Normal heart sounds, S1 normal and S2 normal. No murmur heard.   Pulmonary:     Effort: No respiratory distress.     Breath sounds: Normal breath sounds. No stridor. No wheezing or rales.  Lymphadenopathy:     Head:     Right side of head: No tonsillar adenopathy.     Left side of head: No tonsillar adenopathy.     Cervical: No cervical adenopathy.  Skin:    Findings: No erythema or rash (1 cm erythematous area lower right abdomen).     Nails: There is no clubbing.  Neurological:     Mental Status: He is alert.     Diagnostics:    Spirometry was performed and demonstrated an FEV1 of 1.53 at 47 % of predicted.   Results of an echo obtained 12 July 2019 identified the following:  Mild left ventricular hypertrophy. The left ventricular size is  normal. LV ejection fraction = 55-60%. Left ventricular systolic  function is normal. Left ventricular filling pattern is normal. The  left ventricular wall motion is normal.    Assessment and Plan:   1. Asthma, severe persistent, well-controlled   2. Other allergic rhinitis   3. LPRD (laryngopharyngeal reflux disease)   4. Thrush   5. Cyst of skin     1. Continue Breztri - 2 inhalations 2 times per day with spacer   2. Continue montelukast 10 mg one tablet one time per day  3. Continue Fluticasone 1-2 puffs  each nostril one time per day  4. Continue generic Nexium 40mg  2 times per day  5. Start Famotidine 40 mg - 1 tablet at bedtime  6. Start Diflucan 150 - single dose weekly for 3 weeks  7. Can use the following if needed:    A.  Proventil HFA or similar  B.  Cetirizine 10 mg 1 tablet 1 time per day  C.  Pataday 1 drop each eye 1 time per day  8.  Return to clinic in 4 weeks. Examination of throat with ENT? ACE?  Chest X-ray?  9.  Visit with [facility] dermatology regarding right abdominal cyst.  Lam has several issues but what is bothering him the most is a dry cough and some throat clearing. I will assume that there may be a component of fungal overgrowth as he has had in the past and we will treat him with Diflucan for the next 3 weeks. There may also be a component of reflux induced respiratory disease that is not responding to the use of Nexium twice a day and he will start famotidine. He will continue on a collection of anti-inflammatory agents for his airway as noted above. I will see him back in his clinic in 4 weeks. If he still continues to have a throat clearing-like cough will have his throat evaluated by ENT and will consider removing his ACE inhibitor and obtaining a chest X-ray. As well, he probably has a cyst on his right abdominal wall with recurrent episodes of inflammation and will refer him on to [facility] dermatology regarding that issue.  Allena Katz, MD Allergy / Immunology [place]

## 2019-07-26 NOTE — Patient Instructions (Addendum)
  1. Continue Breztri - 2 inhalations 2 times per day with spacer   2. Continue montelukast 10 mg one tablet one time per day  3. Continue Fluticasone 1-2 puffs each nostril one time per day  4. Continue generic Nexium 40mg  2 times per day  5. Start Famotidine 40 mg - 1 tablet at bedtime  6. Start Diflucan 150 - single dose weekly for 3 weeks  7. Can use the following if needed:    A.  Proventil HFA or similar  B.  Cetirizine 10 mg 1 tablet 1 time per day  C.  Pataday 1 drop each eye 1 time per day  8.  Return to clinic in 4 weeks. Examination of throat with ENT? ACE? Chest X-ray?  9.  Visit with [facility] dermatology regarding right abdominal cyst.

## 2019-07-26 NOTE — Telephone Encounter (Signed)
Please refer patient to [facility]ermatology for right abdominal cyst. Thank you.

## 2019-07-27 ENCOUNTER — Encounter: Payer: Self-pay | Admitting: Allergy and Immunology

## 2019-07-30 ENCOUNTER — Telehealth: Payer: Self-pay | Admitting: Allergy and Immunology

## 2019-07-30 NOTE — Telephone Encounter (Signed)
Bobby Wheeler has been scheduled with Bobby Wheeler on July 30th at 11:00am with Bobby Wheeler.  Bobby Wheeler faxed a letter stating Bobby Wheeler has been made aware of his appointment.

## 2019-09-05 ENCOUNTER — Other Ambulatory Visit: Payer: Self-pay | Admitting: Allergy and Immunology

## 2019-11-05 ENCOUNTER — Other Ambulatory Visit: Payer: Self-pay | Admitting: Allergy and Immunology

## 2019-12-06 ENCOUNTER — Ambulatory Visit: Payer: Medicare Other | Admitting: Allergy and Immunology

## 2019-12-13 ENCOUNTER — Ambulatory Visit (INDEPENDENT_AMBULATORY_CARE_PROVIDER_SITE_OTHER): Payer: Medicare Other | Admitting: Allergy and Immunology

## 2019-12-13 ENCOUNTER — Other Ambulatory Visit: Payer: Self-pay

## 2019-12-13 ENCOUNTER — Encounter: Payer: Self-pay | Admitting: Allergy and Immunology

## 2019-12-13 VITALS — BP 118/80 | HR 102 | Resp 16

## 2019-12-13 DIAGNOSIS — J984 Other disorders of lung: Secondary | ICD-10-CM

## 2019-12-13 DIAGNOSIS — U071 COVID-19: Secondary | ICD-10-CM | POA: Diagnosis not present

## 2019-12-13 DIAGNOSIS — K219 Gastro-esophageal reflux disease without esophagitis: Secondary | ICD-10-CM

## 2019-12-13 DIAGNOSIS — J3089 Other allergic rhinitis: Secondary | ICD-10-CM | POA: Diagnosis not present

## 2019-12-13 DIAGNOSIS — J4550 Severe persistent asthma, uncomplicated: Secondary | ICD-10-CM | POA: Diagnosis not present

## 2019-12-13 NOTE — Patient Instructions (Addendum)
  1. Continue Breztri - 2 inhalations 2 times per day with spacer   2. Continue montelukast 10 mg one tablet one time per day  3. Continue Fluticasone 1-2 puffs each nostril one time per day  4. Continue generic Nexium 40mg  2 times per day  5. Continue Famotidine 40 mg - 1 tablet at bedtime  6. Can use the following if needed:    A.  Proventil HFA or similar  B.  Cetirizine 10 mg 1 tablet 1 time per day  C.  Pataday 1 drop each eye 1 time per day  7. Blood - CBC w/d, area 2 aeroallergen profile, alpha 1 antitrypsin level and phenotype  8.  Visit with pulmonologist  9.  Return to clinic in 12 weeks or earlier if problem

## 2019-12-13 NOTE — Progress Notes (Signed)
[place] [facility] [place] [facility] [place]   Follow-up Note  Referring Provider: Bonnita Nasuti, MD Primary Provider: Bonnita Nasuti, MD Date of Office Visit: 12/13/2019  Subjective:   Bobby Wheeler (DOB: Dec 18, 1950) is a 68 y.o. male who returns to the [facility] [place] on 12/13/2019 in re-evaluation of the following:  HPI: Bobby Wheeler returns to this clinic in evaluation of severe asthma and allergic rhinoconjunctivitis and reflux.  I last saw him in this clinic on 07/26/2019.  During his last visit his LPR appeared to be uncontrolled and we added famotidine to his Nexium and overall he believes that his reflux in his throat is doing very well.  He unfortunately contracted Covid pneumonitis requiring hospitalization in [place] for 7 days with the use of BiPAP and remdesivir.  He has had some long-term fatigue from that issue and some slight shortness of breath if he walks to any great extent but overall has improved significantly regarding his respiratory tract.  He continues on a large collection of medications directed against inflammation.  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  He has received his booster Covid vaccine.  Allergies as of 12/13/2019   No Known Allergies     Medication List      albuterol 108 (90 Base) MCG/ACT inhaler Commonly known as: ProAir HFA Can use two puffs every four to six hours as needed for cough or wheeze.   amLODipine-benazepril 10-20 MG capsule Commonly known as: LOTREL Take 1 capsule by mouth daily.   aspirin EC 81 MG tablet Take 81 mg by mouth daily.   atorvastatin 40 MG tablet Commonly known as: LIPITOR Take 40 mg by mouth daily.   Breztri Aerosphere 160-9-4.8 MCG/ACT Aero Generic drug: Budeson-Glycopyrrol-Formoterol Inhale 2 puffs into the lungs in the morning and at bedtime. Use with spacer.  Rinse, gargle, and spit after use.   EQ Allergy Relief (Cetirizine) 10 MG tablet Generic drug:  cetirizine TAKE 1 TABLET BY MOUTH ONCE DAILY AS NEEDED   esomeprazole 40 MG capsule Commonly known as: NEXIUM Take one capsule twice daily as directed   famotidine 40 MG tablet Commonly known as: PEPCID Take 1 tablet (40 mg total) by mouth at bedtime.   fluconazole 150 MG tablet Commonly known as: DIFLUCAN Take one tablet once a week for three weeks total.   fluticasone 50 MCG/ACT nasal spray Commonly known as: FLONASE Use one to two sprays in each nostril once daily as directed.   hydrochlorothiazide 25 MG tablet Commonly known as: HYDRODIURIL Take 25 mg by mouth daily.   ibuprofen 800 MG tablet Commonly known as: ADVIL Take 800 mg by mouth every 8 (eight) hours as needed. for pain   Jardiance 10 MG Tabs tablet Generic drug: empagliflozin Take 10 mg by mouth daily.   metFORMIN 1000 MG tablet Commonly known as: GLUCOPHAGE 1,000 mg. Take two tablets once daily as directed.   montelukast 10 MG tablet Commonly known as: SINGULAIR Take 1 tablet (10 mg total) by mouth daily.   MULTIVITAMIN ADULT PO Take by mouth daily.   Olopatadine HCl 0.2 % Soln Commonly known as: Pataday Use one drop in each eye once daily if needed   Tadalafil 2.5 MG Tabs Take by mouth.   zolpidem 10 MG tablet Commonly known as: AMBIEN       Past Medical History:  Diagnosis Date  . Asthma   . Diabetes ([place])   . Hypertension   . LPRD (laryngopharyngeal reflux disease)  Past Surgical History:  Procedure Laterality Date  . CATARACT EXTRACTION  2020  . HERNIA REPAIR      Review of systems negative except as noted in HPI / PMHx or noted below:  Review of Systems  Constitutional: Negative.   HENT: Negative.   Eyes: Negative.   Respiratory: Negative.   Cardiovascular: Negative.   Gastrointestinal: Negative.   Genitourinary: Negative.   Musculoskeletal: Negative.   Skin: Negative.   Neurological: Negative.   Endo/Heme/Allergies: Negative.   Psychiatric/Behavioral: Negative.       Objective:   Vitals:   12/13/19 1550  BP: 118/80  Pulse: (!) 102  Resp: 16  SpO2: 96%          Physical Exam Constitutional:      Appearance: He is not diaphoretic.  HENT:     Head: Normocephalic.     Right Ear: Tympanic membrane, ear canal and external ear normal.     Left Ear: Tympanic membrane, ear canal and external ear normal.     Nose: Nose normal. No mucosal edema or rhinorrhea.     Mouth/Throat:     Pharynx: Uvula midline. No oropharyngeal exudate.  Eyes:     Conjunctiva/sclera: Conjunctivae normal.  Neck:     Thyroid: No thyromegaly.     Trachea: Trachea normal. No tracheal tenderness or tracheal deviation.  Cardiovascular:     Rate and Rhythm: Normal rate and regular rhythm.     Heart sounds: Normal heart sounds, S1 normal and S2 normal. No murmur heard.   Pulmonary:     Effort: No respiratory distress.     Breath sounds: Normal breath sounds. No stridor. No wheezing (Expiratory wheezes posterior lung fields left greater than right) or rales.  Lymphadenopathy:     Head:     Right side of head: No tonsillar adenopathy.     Left side of head: No tonsillar adenopathy.     Cervical: No cervical adenopathy.  Skin:    Findings: No erythema or rash.     Nails: There is no clubbing.  Neurological:     Mental Status: He is alert.     Diagnostics:    Spirometry was performed and demonstrated an FEV1 of 1.63 at 50 % of predicted.  Assessment and Plan:   1. Not well controlled severe persistent asthma   2. 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 Other allergic rhinitis   4. LPRD (laryngopharyngeal reflux disease)     1. Continue Breztri - 2 inhalations 2 times per day with spacer   2. Continue montelukast 10 mg one tablet one time per day  3. Continue Fluticasone 1-2 puffs each nostril one time per day  4. Continue generic Nexium 40mg  2 times per day  5. Continue Famotidine 40 mg - 1 tablet at bedtime  6. Can use the following if needed:     A.  Proventil HFA or similar  B.  Cetirizine 10 mg 1 tablet 1 time per day  C.  Pataday 1 drop each eye 1 time per day  7. Blood - CBC w/d, area 2 aeroallergen profile, alpha 1 antitrypsin level and phenotype  8.  Visit with pulmonologist  9.  Return to clinic in 12 weeks or earlier if problem  Bobby Wheeler still continues to have evidence of some inflammation of his airway even while utilizing a large collection of anti-inflammatory agents for his airway.  His reflux appears to be under pretty good control at this point in time while utilizing a  proton pump inhibitor and H2 receptor blocker.  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  I would like to get a second opinion for the pulmonologist regarding his respiratory tract condition especially with his recent Covid pneumonitis.  I will see him back in this clinic in 12 weeks or earlier if there is a problem.  Allena Katz, MD Allergy / Immunology [place]

## 2019-12-14 ENCOUNTER — Encounter: Payer: Self-pay | Admitting: Allergy and Immunology

## 2019-12-14 ENCOUNTER — Telehealth: Payer: Self-pay | Admitting: Allergy and Immunology

## 2019-12-14 NOTE — Telephone Encounter (Signed)
Bobby Wheeler has been internally referred to Dr. Marshell Garfinkel at [facility].  I will follow through in a couple of days to check the status of his referral.

## 2019-12-15 LAB — CBC WITH DIFFERENTIAL
Basophils Absolute: 0.1 10*3/uL (ref 0.0–0.2)
Basos: 1 %
EOS (ABSOLUTE): 0.6 10*3/uL — ABNORMAL HIGH (ref 0.0–0.4)
Eos: 7 %
Hematocrit: 49.3 % (ref 37.5–51.0)
Hemoglobin: 16.3 g/dL (ref 13.0–17.7)
Immature Grans (Abs): 0.1 10*3/uL (ref 0.0–0.1)
Immature Granulocytes: 1 %
Lymphocytes Absolute: 1.9 10*3/uL (ref 0.7–3.1)
Lymphs: 23 %
MCH: 27.7 pg (ref 26.6–33.0)
MCHC: 33.1 g/dL (ref 31.5–35.7)
MCV: 84 fL (ref 79–97)
Monocytes Absolute: 0.7 10*3/uL (ref 0.1–0.9)
Monocytes: 8 %
Neutrophils Absolute: 5.1 10*3/uL (ref 1.4–7.0)
Neutrophils: 60 %
RBC: 5.89 x10E6/uL — ABNORMAL HIGH (ref 4.14–5.80)
RDW: 14.1 % (ref 11.6–15.4)
WBC: 8.5 10*3/uL (ref 3.4–10.8)

## 2019-12-15 LAB — ALLERGENS W/TOTAL IGE AREA 2
Alternaria Alternata IgE: <0.1 kU/L
Aspergillus Fumigatus IgE: <0.1 kU/L
Bermuda Grass IgE: 1.34 kU/L — AB
Cat Dander IgE: <0.1 kU/L
Cedar, Mountain IgE: <0.1 kU/L
Cladosporium Herbarum IgE: <0.1 kU/L
Cockroach, German IgE: <0.1 kU/L
Common Silver Birch IgE: 0.29 kU/L — AB
Cottonwood IgE: <0.1 kU/L
D Farinae IgE: <0.1 kU/L
D Pteronyssinus IgE: 0.1 kU/L — AB
Dog Dander IgE: <0.1 kU/L
Elm, American IgE: 0.38 kU/L — AB
IgE (Immunoglobulin E), Serum: 192 IU/mL (ref 6–495)
Johnson Grass IgE: 2.41 kU/L — AB
Maple/Box Elder IgE: <0.1 kU/L
Mouse Urine IgE: <0.1 kU/L
Oak, White IgE: 0.31 kU/L — AB
Pecan, Hickory IgE: 4.4 kU/L — AB
Penicillium Chrysogen IgE: <0.1 kU/L
Pigweed, Rough IgE: <0.1 kU/L
Ragweed, Short IgE: <0.1 kU/L
Sheep Sorrel IgE Qn: 0.1 kU/L — AB
Timothy Grass IgE: 6.12 kU/L — AB
White Mulberry IgE: 5.93 kU/L — AB

## 2019-12-15 LAB — ALPHA-1 ANTITRYPSIN PHENOTYPE: A-1 Antitrypsin: 135 mg/dL (ref 101–187)

## 2019-12-20 NOTE — Telephone Encounter (Signed)
Called [facility] to check on referral status.  They have not scheduled patient yet.  I asked them to let us know when patient has been scheduled.

## 2019-12-23 ENCOUNTER — Telehealth: Payer: Self-pay | Admitting: Pulmonary Disease

## 2019-12-23 NOTE — Telephone Encounter (Signed)
Bobby Garfinkel, MD  Elton Sin, LPN Please schedule next available for new consult with me    ATC Patient.  Left detailed message on VM for Patient to call and schedule Consult appointment with Dr. Vaughan Browner.

## 2019-12-30 NOTE — Telephone Encounter (Signed)
Patient scheduled 02/11/20 with Dr. Vaughan Browner.  Nothing further at this time.

## 2020-02-01 ENCOUNTER — Telehealth: Payer: Self-pay | Admitting: *Deleted

## 2020-02-01 NOTE — Telephone Encounter (Signed)
Had spoken to patient after visit with Dr Neldon Mc in regards to starting Berna Bue and would need his [place] card info for [facility] but he never called back with info. L/m 12/14 in regards to needing info and patient never called back with info. If patient decides in future he wants to pursue Berna Bue he can reach out to me with info

## 2020-02-03 ENCOUNTER — Ambulatory Visit (INDEPENDENT_AMBULATORY_CARE_PROVIDER_SITE_OTHER): Payer: Medicare Other | Admitting: Allergy and Immunology

## 2020-02-03 ENCOUNTER — Other Ambulatory Visit: Payer: Self-pay

## 2020-02-03 ENCOUNTER — Encounter: Payer: Self-pay | Admitting: Allergy and Immunology

## 2020-02-03 VITALS — BP 140/80 | HR 88 | Resp 16 | Ht 69.0 in | Wt 204.8 lb

## 2020-02-03 DIAGNOSIS — J3089 Other allergic rhinitis: Secondary | ICD-10-CM | POA: Diagnosis not present

## 2020-02-03 DIAGNOSIS — J984 Other disorders of lung: Secondary | ICD-10-CM | POA: Diagnosis not present

## 2020-02-03 DIAGNOSIS — J4550 Severe persistent asthma, uncomplicated: Secondary | ICD-10-CM

## 2020-02-03 DIAGNOSIS — U071 COVID-19: Secondary | ICD-10-CM

## 2020-02-03 DIAGNOSIS — K219 Gastro-esophageal reflux disease without esophagitis: Secondary | ICD-10-CM | POA: Diagnosis not present

## 2020-02-03 MED ORDER — MONTELUKAST SODIUM 10 MG PO TABS
10.0000 mg | ORAL_TABLET | Freq: Every day | ORAL | 1 refills | Status: DC
Start: 2020-02-03 — End: 2020-04-07

## 2020-02-03 MED ORDER — METHYLPREDNISOLONE ACETATE 80 MG/ML IJ SUSP
80.0000 mg | Freq: Once | INTRAMUSCULAR | Status: AC
Start: 2020-02-03 — End: 2020-02-03
  Administered 2020-02-03: 80 mg via INTRAMUSCULAR

## 2020-02-03 MED ORDER — BREZTRI AEROSPHERE 160-9-4.8 MCG/ACT IN AERO: 2.0000 | INHALATION_SPRAY | Freq: Two times a day (BID) | RESPIRATORY_TRACT | 5 refills | Status: DC

## 2020-02-03 MED ORDER — ALBUTEROL SULFATE HFA 108 (90 BASE) MCG/ACT IN AERS: INHALATION_SPRAY | RESPIRATORY_TRACT | 1 refills | Status: DC

## 2020-02-03 NOTE — Progress Notes (Signed)
[place] [facility] [place] [facility] [place]   Follow-up Note  Referring Provider: Bonnita Nasuti, MD Primary Provider: Bonnita Nasuti, MD Date of Office Visit: 02/03/2020  Subjective:   Bobby Wheeler (DOB: 03/25/1950) is a 70 y.o. male who returns to the [facility] on 02/03/2020 in re-evaluation of the following:  HPI: Burnell returns to this clinic in evaluation of his severe asthma and allergic rhinoconjunctivitis and reflux.  His last visit to this clinic was 13 December 2019.  During his last visit he was undergoing rehabilitation from his COVID pneumonitis requiring hospitalization and treatment with remdesivir.  We placed him on a collection of various anti-inflammatory agents for his airway and he did okay for December and he did okay for the beginning of January but 1 week ago he once again developed wheezing and coughing and shortness of breath and has been using a short acting bronchodilator without any other associated systemic or constitutional symptoms without any upper airway symptoms and without any reflux.  He continues on a triple inhaler and montelukast and nasal fluticasone to address his inflammation.  He continues on Nexium twice a day and famotidine in the evening to address his reflux.  Allergies as of 02/03/2020   No Known Allergies     Medication List      albuterol 108 (90 Base) MCG/ACT inhaler Commonly known as: ProAir HFA Can use two puffs every four to six hours as needed for cough or wheeze.   amLODipine-benazepril 10-20 MG capsule Commonly known as: LOTREL Take 1 capsule by mouth daily.   aspirin EC 81 MG tablet Take 81 mg by mouth daily.   atorvastatin 40 MG tablet Commonly known as: LIPITOR Take 40 mg by mouth daily.   Breztri Aerosphere 160-9-4.8 MCG/ACT Aero Generic drug: Budeson-Glycopyrrol-Formoterol Inhale 2 puffs into the lungs in the morning and at bedtime. Use with spacer.  Rinse, gargle, and spit after  use.   EQ Allergy Relief (Cetirizine) 10 MG tablet Generic drug: cetirizine TAKE 1 TABLET BY MOUTH ONCE DAILY AS NEEDED   esomeprazole 40 MG capsule Commonly known as: NEXIUM Take one capsule twice daily as directed   famotidine 40 MG tablet Commonly known as: PEPCID Take 1 tablet (40 mg total) by mouth at bedtime.   fluconazole 150 MG tablet Commonly known as: DIFLUCAN Take one tablet once a week for three weeks total.   fluticasone 50 MCG/ACT nasal spray Commonly known as: FLONASE Use one to two sprays in each nostril once daily as directed.   hydrochlorothiazide 25 MG tablet Commonly known as: HYDRODIURIL Take 25 mg by mouth daily.   ibuprofen 800 MG tablet Commonly known as: ADVIL Take 800 mg by mouth every 8 (eight) hours as needed. for pain   Jardiance 10 MG Tabs tablet Generic drug: empagliflozin Take 10 mg by mouth daily.   metFORMIN 1000 MG tablet Commonly known as: GLUCOPHAGE 1,000 mg. Take two tablets once daily as directed.   montelukast 10 MG tablet Commonly known as: SINGULAIR Take 1 tablet (10 mg total) by mouth daily.   MULTIVITAMIN ADULT PO Take by mouth daily.   Olopatadine HCl 0.2 % Soln Commonly known as: Pataday Use one drop in each eye once daily if needed   Tadalafil 2.5 MG Tabs Take by mouth.   zolpidem 10 MG tablet Commonly known as: AMBIEN       Past Medical History:  Diagnosis Date  . Asthma   . Diabetes ([place])   .  Hypertension   . LPRD (laryngopharyngeal reflux disease)     Past Surgical History:  Procedure Laterality Date  . CATARACT EXTRACTION  2020  . HERNIA REPAIR      Review of systems negative except as noted in HPI / PMHx or noted below:  Review of Systems  Constitutional: Negative.   HENT: Negative.   Eyes: Negative.   Respiratory: Negative.   Cardiovascular: Negative.   Gastrointestinal: Negative.   Genitourinary: Negative.   Musculoskeletal: Negative.   Skin: Negative.   Neurological: Negative.    Endo/Heme/Allergies: Negative.   Psychiatric/Behavioral: Negative.      Objective:   Vitals:   02/03/20 1526  BP: 140/80  Pulse: 88  Resp: 16  SpO2: 98%   Height: 5\' 9"  (175.3 cm)  Weight: 204 lb 12.8 oz (92.9 kg)   Physical Exam Constitutional:      Appearance: He is not diaphoretic.  HENT:     Head: Normocephalic.     Right Ear: Tympanic membrane, ear canal and external ear normal.     Left Ear: Tympanic membrane, ear canal and external ear normal.     Nose: Nose normal. No mucosal edema or rhinorrhea.     Mouth/Throat:     Mouth: Oropharynx is clear and moist and mucous membranes are normal.     Pharynx: Uvula midline. No oropharyngeal exudate.  Eyes:     Conjunctiva/sclera: Conjunctivae normal.  Neck:     Thyroid: No thyromegaly.     Trachea: Trachea normal. No tracheal tenderness or tracheal deviation.  Cardiovascular:     Rate and Rhythm: Normal rate and regular rhythm.     Heart sounds: Normal heart sounds, S1 normal and S2 normal. No murmur heard.   Pulmonary:     Effort: No respiratory distress.     Breath sounds: No stridor. Wheezing (Bilateral inspiratory and expiratory wheezing mid lung field) present. No rales.  Musculoskeletal:        General: No edema.  Lymphadenopathy:     Head:     Right side of head: No tonsillar adenopathy.     Left side of head: No tonsillar adenopathy.     Cervical: No cervical adenopathy.  Skin:    Findings: No erythema or rash.     Nails: There is no clubbing.  Neurological:     Mental Status: He is alert.     Diagnostics:    Spirometry was performed and demonstrated an FEV1 of 2.0 at 3.13 % of predicted.   Results of blood tests obtained 13 December 2019 identified alpha-1 antitrypsin level 135 mg/DL with MM phenotype, WBC 8.5, absolute eosinophil 600, lymphocyte 1900, hemoglobin 16.3, serum IgE 192 KU/L, IgE antibodies directed against various pollens including grasses, trees, weeds.  Assessment and Plan:   1.  Not well controlled severe persistent asthma   2. 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 Other allergic rhinitis   4. LPRD (laryngopharyngeal reflux disease)     1. Continue Breztri - 2 inhalations 2 times per day with spacer   2. Continue montelukast 10 mg one tablet one time per day  3. Continue Fluticasone 1-2 puffs each nostril one time per day  4. Continue generic Nexium 40mg  2 times per day  5. Continue Famotidine 40 mg - 1 tablet at bedtime  6. Can use the following if needed:    A.  Proventil HFA or similar  B.  Cetirizine 10 mg 1 tablet 1 time per day  C.  Pataday 1 drop  each eye 1 time per day  7.  Depo-Medrol 80 IM delivered in clinic today  8.  Make contact with Tammy about starting Fasenra  9. Start a course of immunotherapy  10.  Return to clinic in 12 weeks or earlier if problem  Eban has evidence of inflammation affecting his respiratory tract and we will treat him with the agents noted above.  On a long-term basis I think he would do better utilizing a course of immunotherapy and also a biologic agent directed against his eosinophilia.  We will see if we can get these arranged soon as possible.  He will continue on a large collection of medications directed against respiratory tract inflammation and reflux as noted above.  I will see him back in this clinic in 12 weeks or earlier if there is a problem.  Allena Katz, MD Allergy / Immunology [place]

## 2020-02-03 NOTE — Patient Instructions (Addendum)
  1. Continue Breztri - 2 inhalations 2 times per day with spacer   2. Continue montelukast 10 mg one tablet one time per day  3. Continue Fluticasone 1-2 puffs each nostril one time per day  4. Continue generic Nexium 40mg  2 times per day  5. Continue Famotidine 40 mg - 1 tablet at bedtime  6. Can use the following if needed:    A.  Proventil HFA or similar  B.  Cetirizine 10 mg 1 tablet 1 time per day  C.  Pataday 1 drop each eye 1 time per day  7.  Depo-Medrol 80 IM delivered in clinic today  8.  Make contact with Tammy about starting Fasenra  9.  Start a course of immunotherapy  10. Return to clinic in 12 weeks or earlier if problem

## 2020-02-04 ENCOUNTER — Encounter: Payer: Self-pay | Admitting: Allergy and Immunology

## 2020-02-11 ENCOUNTER — Institutional Professional Consult (permissible substitution): Payer: Self-pay | Admitting: Pulmonary Disease

## 2020-02-14 ENCOUNTER — Institutional Professional Consult (permissible substitution): Payer: Self-pay | Admitting: Pulmonary Disease

## 2020-02-25 ENCOUNTER — Other Ambulatory Visit: Payer: Self-pay

## 2020-02-25 ENCOUNTER — Encounter: Payer: Self-pay | Admitting: Pulmonary Disease

## 2020-02-25 ENCOUNTER — Ambulatory Visit (INDEPENDENT_AMBULATORY_CARE_PROVIDER_SITE_OTHER): Payer: Medicare Other

## 2020-02-25 ENCOUNTER — Ambulatory Visit (INDEPENDENT_AMBULATORY_CARE_PROVIDER_SITE_OTHER): Payer: Medicare Other | Admitting: Pulmonary Disease

## 2020-02-25 VITALS — BP 120/70 | HR 88 | Temp 97.1°F | Ht 69.0 in | Wt 201.0 lb

## 2020-02-25 DIAGNOSIS — U071 COVID-19: Secondary | ICD-10-CM

## 2020-02-25 DIAGNOSIS — J4540 Moderate persistent asthma, uncomplicated: Secondary | ICD-10-CM

## 2020-02-25 NOTE — Patient Instructions (Signed)
Nice to meet you  I agree with the allergy doctors plan.  Continue all of the inhalers and medicines you are on.  I agree with the injectable medicine.  I am optimistic it will help with your cough and your wheeze.  We will need a chest x-ray today to establish new baseline after Covid infection in the summer 2021.  I am glad you are doing much better.  Return to clinic for follow-up with Dr. Silas Flood in 3 months

## 2020-02-29 NOTE — Progress Notes (Signed)
$'@Patient'n$  ID: Bobby Wheeler, male    DOB: Mar 31, 1950, 70 y.o.   MRN: [id number]  Chief Complaint  Patient presents with  . Consult    Referred by Dr. Neldon Mc for asthma. States he was diagnosed with COVID back in Sept 2021. States his asthma has been under control for the past few days.     Referring provider: Bonnita Nasuti, MD  HPI:   70 year old man whom we are seeing in consultation after COVID-19 pneumonia 07/2019 and for asthma.  Notes from allergist and immunologist extensively reviewed.  Overall, patient feels okay.  He feels like his asthma control is getting better over time.  He is on maximal inhaled therapy as well as oral montelukast and several medicines for rhino nasal allergy symptoms.  He still has a persistent cough.  Sometimes dry, sometimes productive.  Denies any postnasal drip.  Denies significant reflux.  No clear alleviating or exacerbating factors.  No environment works better or worse.  No timing during the day worse better or worse.  Patient was visiting being [place] in July 2021.  He developed COVID-19 infection.  He was hospitalized for several days per his report.  He did require BiPAP at some point time.  Never intubated.  He was discharged without oxygen.  Is taking a many months to come to get the strength back.  He feels like he is close now but not quite.  He brought physical films with him from the hospital in [place].  These were personally reviewed including chest x-rays x2 as well as CT scan.  Both demonstrate bilateral interstitial and alveolar filling infiltrates better demonstrated on CT scan with predilection for peribronchovascular areas which are quite dense on my interpretation.  PMH: Asthma, seasonal allergies, severe COVID-19 pneumonia 07/2019 on BiPAP never intubated Surgical history: Cataract, hernia surgery Family history: Mother with asthma, stroke, sister with diabetes Social history: Former smoker, quit around 1990, plans to retire to  [place] at some point, retired from previous job, lives in [place] / Pulmonary Flowsheets:   ACT:  Asthma Control Test ACT Total Score  05/12/2017 13  02/27/2017 19  01/17/2016 12    MMRC: No flowsheet data found.  Epworth:  No flowsheet data found.  Tests:   FENO:  No results found for: NITRICOXIDE  PFT: No flowsheet data found.  WALK:  No flowsheet data found.  Imaging: Personally reviewed films he brought from [place], please see above DG Chest 2 View  Result Date: 02/27/2020 CLINICAL DATA:  COVID 19 infection in July 2021 EXAM: CHEST - 2 VIEW COMPARISON:  01/18/2016 chest radiograph. FINDINGS: Stable cardiomediastinal silhouette with normal heart size. No pneumothorax. No pleural effusion. No pulmonary edema. No acute consolidative airspace disease. Slight prominence of the parahilar interstitial markings, new. IMPRESSION: New slight prominence of the parahilar interstitial markings, nonspecific. If there is clinical concern for postinflammatory fibrosis, dedicated high-resolution chest CT could be obtained for further evaluation. Electronically Signed   By: Ilona Sorrel M.D.   On: 02/27/2020 17:40    Lab Results: Personally reviewed, eosinophils persistently elevated CBC    Component Value Date/Time   WBC 8.5 12/13/2019 1653   RBC 5.89 (H) 12/13/2019 1653   HGB 16.3 12/13/2019 1653   HCT 49.3 12/13/2019 1653   MCV 84 12/13/2019 1653   MCH 27.7 12/13/2019 1653   MCHC 33.1 12/13/2019 1653   RDW 14.1 12/13/2019 1653   LYMPHSABS 1.9 12/13/2019 1653   EOSABS 0.6 (H) 12/13/2019 1653   BASOSABS 0.1  12/13/2019 1653    BMET    Component Value Date/Time   NA 138 12/03/2007 0845   K 4.8 12/03/2007 0845   CL 103 12/03/2007 0845   CO2 27 12/03/2007 0845   GLUCOSE 112 (H) 12/03/2007 0845   BUN 21 12/03/2007 0845   CREATININE 1.20 12/03/2007 0845   CALCIUM 8.9 12/03/2007 0845   GFRNONAA >60 12/03/2007 0845   GFRAA  12/03/2007 0845    >60         The eGFR has been calculated using the MDRD equation. This calculation has not been validated in all clinical    BNP No results found for: BNP  ProBNP No results found for: PROBNP  Specialty Problems   None     No Known Allergies   There is no immunization history on file for this patient.  Past Medical History:  Diagnosis Date  . Asthma   . Diabetes ([place])   . Hypertension   . LPRD (laryngopharyngeal reflux disease)     Tobacco History: Social History   Tobacco Use  Smoking Status Former Smoker  . Quit date: 01/15/1983  . Years since quitting: 37.1  Smokeless Tobacco Never Used   Counseling given: Not Answered   Continue to not smoke  Outpatient Encounter Medications as of 02/25/2020  Medication Sig  . albuterol (PROAIR HFA) 108 (90 Base) MCG/ACT inhaler Can use two puffs every four to six hours as needed for cough or wheeze.  Marland Kitchen amLODipine-benazepril (LOTREL) 10-20 MG capsule Take 1 capsule by mouth daily.   Marland Kitchen aspirin EC 81 MG tablet Take 81 mg by mouth daily.   Marland Kitchen atorvastatin (LIPITOR) 40 MG tablet Take 40 mg by mouth daily.  . Budeson-Glycopyrrol-Formoterol (BREZTRI AEROSPHERE) 160-9-4.8 MCG/ACT AERO Inhale 2 puffs into the lungs in the morning and at bedtime. Use with spacer.  Rinse, gargle, and spit after use.  Marland Kitchen doxazosin (CARDURA) 2 MG tablet Take 2 mg by mouth daily.  Noelle Penner ALLERGY RELIEF, CETIRIZINE, 10 MG tablet TAKE 1 TABLET BY MOUTH ONCE DAILY AS NEEDED  . esomeprazole (NEXIUM) 40 MG capsule Take one capsule twice daily as directed  . famotidine (PEPCID) 40 MG tablet Take 1 tablet (40 mg total) by mouth at bedtime.  . fluticasone (FLONASE) 50 MCG/ACT nasal spray Use one to two sprays in each nostril once daily as directed.  . hydrochlorothiazide (HYDRODIURIL) 25 MG tablet Take 25 mg by mouth daily.   Marland Kitchen ibuprofen (ADVIL,MOTRIN) 800 MG tablet Take 800 mg by mouth every 8 (eight) hours as needed. for pain  . JARDIANCE 10 MG TABS tablet Take 10 mg by  mouth daily.  . metFORMIN (GLUCOPHAGE) 1000 MG tablet 1,000 mg. Take two tablets once daily as directed.  . montelukast (SINGULAIR) 10 MG tablet Take 1 tablet (10 mg total) by mouth daily.  . Multiple Vitamins-Minerals (MULTIVITAMIN ADULT PO) Take by mouth daily.  . Olopatadine HCl (PATADAY) 0.2 % SOLN Use one drop in each eye once daily if needed  . Tadalafil 2.5 MG TABS Take by mouth.  . [DISCONTINUED] fluconazole (DIFLUCAN) 150 MG tablet Take one tablet once a week for three weeks total.  . [DISCONTINUED] zolpidem (AMBIEN) 10 MG tablet    No facility-administered encounter medications on file as of 02/25/2020.     Review of Systems  Review of Systems  No chest pain with exertion.  No orthopnea or PND.  No lower extremity swelling.  Comprehensive review of systems otherwise negative. Physical Exam  BP 120/70  Pulse 88   Temp (!) 97.1 F (36.2 C) (Temporal)   Ht $R'5\' 9"'KG$  (1.753 m)   Wt 201 lb (91.2 kg)   SpO2 99% Comment: on RA  BMI 29.68 kg/m   Wt Readings from Last 5 Encounters:  02/25/20 201 lb (91.2 kg)  02/03/20 204 lb 12.8 oz (92.9 kg)  04/15/19 196 lb 3.2 oz (89 kg)  02/23/15 205 lb 0.4 oz (93 kg)    BMI Readings from Last 5 Encounters:  02/25/20 29.68 kg/m  02/03/20 30.24 kg/m  04/15/19 28.97 kg/m  02/23/15 30.28 kg/m     Physical Exam General: Well-appearing, in no acute distress Eyes: EOMI, icterus Neck: Supple, no JVP Cardiovascular: Regular rhythm, no murmurs Pulmonary: Clear to auscultate bilaterally, normal work of breathing Abdomen: Mild distention, bowel sounds present MSK: No synovitis, joint effusion Neuro: Normal gait, no weakness Psych: No mood, full affect   Assessment & Plan:   COVID 19 Pna: July 2021 while visiting at this time.  Was in the hospital for many days, on BiPAP, high flow.  Chest imaging with diffuse significant bilateral infiltrates.  Overall seems much improved.  Will order chest x-ray today to establish new imaging  baseline as it has been 6 months or greater since onset of symptoms and hospitalization.  Asthma: Being seen by allergy and immunology.  Notes and plan reviewed as above.  I agree with ongoing plan and think he would benefit from [place] given ongoing cough.  This is being coordinated by his allergist.  Encouraged him to continue follow-up and continue with the plan he is on.  He is to continue all inhalers, oral medicines as prescribed.   Return in about 3 months (around 05/24/2020).   [name], MD 02/29/2020

## 2020-03-07 ENCOUNTER — Telehealth: Payer: Self-pay | Admitting: Pulmonary Disease

## 2020-03-07 NOTE — Telephone Encounter (Signed)
Spoke with patient. He verbalized understanding of results. He wanted to know when he should have another xray. I reviewed his last note and it stated for him to follow up in May 2022. I advised him that based on that exam, Dr. Silas Flood may want to do another xray.   While on the phone, he did state that he was about to travel overseas and would not be back until late March 2022.

## 2020-03-07 NOTE — Telephone Encounter (Signed)
MH pt is calling about results of the cxr that was done on 02/25/2020.  Please advise.  thanks

## 2020-03-07 NOTE — Telephone Encounter (Signed)
Called patient but call went straight to VM. Left message for patient to call back for results.

## 2020-03-07 NOTE — Telephone Encounter (Signed)
Chest xray looks largely clear, normal. Small area in the bottom left lung that could be subtle or mild scar from COVID. But overall very reassuring.

## 2020-03-13 ENCOUNTER — Ambulatory Visit: Payer: Medicare Other | Admitting: Allergy and Immunology

## 2020-04-06 ENCOUNTER — Other Ambulatory Visit: Payer: Self-pay

## 2020-04-06 ENCOUNTER — Encounter: Payer: Self-pay | Admitting: Allergy and Immunology

## 2020-04-06 ENCOUNTER — Ambulatory Visit (INDEPENDENT_AMBULATORY_CARE_PROVIDER_SITE_OTHER): Payer: Medicare Other | Admitting: Allergy and Immunology

## 2020-04-06 VITALS — BP 124/82 | HR 77 | Resp 16

## 2020-04-06 DIAGNOSIS — J4550 Severe persistent asthma, uncomplicated: Secondary | ICD-10-CM | POA: Diagnosis not present

## 2020-04-06 DIAGNOSIS — J3089 Other allergic rhinitis: Secondary | ICD-10-CM

## 2020-04-06 DIAGNOSIS — K219 Gastro-esophageal reflux disease without esophagitis: Secondary | ICD-10-CM

## 2020-04-06 MED ORDER — BENRALIZUMAB 30 MG/ML ~~LOC~~ SOSY: 30.0000 mg | PREFILLED_SYRINGE | Freq: Once | SUBCUTANEOUS | Status: DC

## 2020-04-06 MED ORDER — BENRALIZUMAB 30 MG/ML ~~LOC~~ SOSY
30.0000 mg | PREFILLED_SYRINGE | Freq: Once | SUBCUTANEOUS | Status: AC
Start: 2020-04-06 — End: 2020-04-06
  Administered 2020-04-06: 30 mg via SUBCUTANEOUS

## 2020-04-06 MED ORDER — EPINEPHRINE 0.3 MG/0.3ML IJ SOAJ: 0.3000 mg | Freq: Once | INTRAMUSCULAR | 1 refills | Status: AC

## 2020-04-06 MED ORDER — MOMETASONE FUROATE 0.1 % EX OINT: TOPICAL_OINTMENT | CUTANEOUS | 5 refills | Status: DC

## 2020-04-06 NOTE — Progress Notes (Unsigned)
Immunotherapy   Patient Details  Name: Bobby Wheeler MRN: [id number] Date of Birth: 12/02/1950  04/06/2020  Bobby Wheeler received sample of Berna Bue in clinic today without any issues. Injection was administered on the left arm. A prescription for Epi-Pen was sent in to the pharmacy today.  Consent signed and patient instructions given. Patient needs to contact Tammy about starting Fasenra.   Bobby Wheeler 04/06/2020, 4:55 PM

## 2020-04-06 NOTE — Progress Notes (Signed)
[facility] [place] [facility] [place]   Follow-up Note  Referring Provider: Bonnita Nasuti, MD Primary Provider: Bonnita Nasuti, MD Date of Office Visit: 04/06/2020  Subjective:   Bobby Wheeler (DOB: 01-10-1951) is a 70 y.o. male who returns to the [facility] on 04/06/2020 in re-evaluation of the following:  HPI: Bobby Wheeler returns to this clinic in reevaluation of severe asthma and allergic rhinoconjunctivitis and reflux.  His last visit to this clinic was 03 February 2020.  He once again has developed lots of problems with coughing and wheezing and using his bronchodilator on a daily basis even though his eyes and nose are doing well ever since the pollen arrived over the course of the past several weeks.  He has also been developing some eczema on his hands during this timeframe.  His reflux is under very good control at this point in time on his current therapy.  Allergies as of 04/06/2020   No Known Allergies     Medication List      albuterol 108 (90 Base) MCG/ACT inhaler Commonly known as: ProAir HFA Can use two puffs every four to six hours as needed for cough or wheeze.   amLODipine-benazepril 10-20 MG capsule Commonly known as: LOTREL Take 1 capsule by mouth daily.   aspirin EC 81 MG tablet Take 81 mg by mouth daily.   atorvastatin 40 MG tablet Commonly known as: LIPITOR Take 40 mg by mouth daily.   Breztri Aerosphere 160-9-4.8 MCG/ACT Aero Generic drug: Budeson-Glycopyrrol-Formoterol Inhale 2 puffs into the lungs in the morning and at bedtime. Use with spacer.  Rinse, gargle, and spit after use.   doxazosin 2 MG tablet Commonly known as: CARDURA Take 2 mg by mouth daily.   EQ Allergy Relief (Cetirizine) 10 MG tablet Generic drug: cetirizine TAKE 1 TABLET BY MOUTH ONCE DAILY AS NEEDED   esomeprazole 40 MG capsule Commonly known as: NEXIUM Take one capsule twice daily as directed   famotidine 40 MG tablet Commonly known  as: PEPCID Take 1 tablet (40 mg total) by mouth at bedtime.   fluticasone 50 MCG/ACT nasal spray Commonly known as: FLONASE Use one to two sprays in each nostril once daily as directed.   hydrochlorothiazide 25 MG tablet Commonly known as: HYDRODIURIL Take 25 mg by mouth daily.   ibuprofen 800 MG tablet Commonly known as: ADVIL Take 800 mg by mouth every 8 (eight) hours as needed. for pain   Jardiance 10 MG Tabs tablet Generic drug: empagliflozin Take 10 mg by mouth daily.   metFORMIN 1000 MG tablet Commonly known as: GLUCOPHAGE 1,000 mg. Take two tablets once daily as directed.   montelukast 10 MG tablet Commonly known as: SINGULAIR Take 1 tablet (10 mg total) by mouth daily.   MULTIVITAMIN ADULT PO Take by mouth daily.   Olopatadine HCl 0.2 % Soln Commonly known as: Pataday Use one drop in each eye once daily if needed   Tadalafil 2.5 MG Tabs Take by mouth.       Past Medical History:  Diagnosis Date  . Asthma   . Diabetes ([place])   . Hypertension   . LPRD (laryngopharyngeal reflux disease)     Past Surgical History:  Procedure Laterality Date  . CATARACT EXTRACTION  2020  . HERNIA REPAIR      Review of systems negative except as noted in HPI / PMHx or noted below:  Review of Systems  Constitutional: Negative.   HENT: Negative.  Eyes: Negative.   Respiratory: Negative.   Cardiovascular: Negative.   Gastrointestinal: Negative.   Genitourinary: Negative.   Musculoskeletal: Negative.   Skin: Negative.   Neurological: Negative.   Endo/Heme/Allergies: Negative.   Psychiatric/Behavioral: Negative.      Objective:   Vitals:   04/06/20 1524  BP: 124/82  Pulse: 77  Resp: 16  SpO2: 96%          Physical Exam Constitutional:      Appearance: He is not diaphoretic.  HENT:     Head: Normocephalic.     Right Ear: Tympanic membrane, ear canal and external ear normal.     Left Ear: Tympanic membrane, ear canal and external ear normal.      Nose: Nose normal. No mucosal edema or rhinorrhea.     Mouth/Throat:     Pharynx: Uvula midline. No oropharyngeal exudate.  Eyes:     Conjunctiva/sclera: Conjunctivae normal.  Neck:     Thyroid: No thyromegaly.     Trachea: Trachea normal. No tracheal tenderness or tracheal deviation.  Cardiovascular:     Rate and Rhythm: Normal rate and regular rhythm.     Heart sounds: Normal heart sounds, S1 normal and S2 normal. No murmur heard.   Pulmonary:     Effort: No respiratory distress.     Breath sounds: No stridor. Wheezing (Bilateral expiratory wheezing posterior lung fields) present. No rales.  Lymphadenopathy:     Head:     Right side of head: No tonsillar adenopathy.     Left side of head: No tonsillar adenopathy.     Cervical: No cervical adenopathy.  Skin:    Findings: No erythema or rash (Palmar and finger eczema).     Nails: There is no clubbing.  Neurological:     Mental Status: He is alert.     Diagnostics:    Spirometry was performed and demonstrated an FEV1 of 2.07 at 66 % of predicted.  Assessment and Plan:   1. Not well controlled severe persistent asthma   2. Other allergic rhinitis   3. LPRD (laryngopharyngeal reflux disease)     1. Continue Breztri - 2 inhalations 2 times per day with spacer   2. Continue montelukast 10 mg one tablet one time per day  3. Continue Fluticasone 1-2 puffs each nostril one time per day  4. Continue generic Nexium 40mg  2 times per day  5. Continue Famotidine 40 mg - 1 tablet at bedtime  6. Can use the following if needed:    A.  Proventil HFA or similar  B.  Cetirizine 10 mg 1 tablet 1 time per day  C.  Pataday 1 drop each eye 1 time per day  D.  Mometasone 0.1% ointment applied to hands 1 time per day  7.  Sample of benralizumab administered in clinic today  8.  Make contact with Bobby Wheeler about starting Fasenra (benralizumab)  9.  Can start a course of immunotherapy  10. Return to clinic in 12 weeks or earlier if  problem  I will have Bobby Wheeler utilize anti-inflammatory agents for his airway as noted above and we will start him on benralizumab today.  There has been a significant logistical block in our attempt to get him to utilize benralizumab on a consistent basis and I can only point him in the right direction but it will require him to make contact with Bobby Wheeler to get the paperwork started so that he can get benralizumab approved by his insurance company.  He can  also start a course of immunotherapy.  I will see him back in his clinic in 12 weeks or earlier if there is a problem.  Allena Katz, MD Allergy / Immunology [place]

## 2020-04-06 NOTE — Patient Instructions (Addendum)
  1. Continue Breztri - 2 inhalations 2 times per day with spacer   2. Continue montelukast 10 mg one tablet one time per day  3. Continue Fluticasone 1-2 puffs each nostril one time per day  4. Continue generic Nexium 40mg  2 times per day  5. Continue Famotidine 40 mg - 1 tablet at bedtime  6. Can use the following if needed:    A.  Proventil HFA or similar  B.  Cetirizine 10 mg 1 tablet 1 time per day  C.  Pataday 1 drop each eye 1 time per day  D.  Mometasone 0.1% ointment applied to hands 1 time per day  7.  Sample of benralizumab administered in clinic today  8.  Make contact with Tammy about starting Fasenra (benralizumab)  9.  Can start a course of immunotherapy  10. Return to clinic in 12 weeks or earlier if problem

## 2020-04-07 ENCOUNTER — Encounter: Payer: Self-pay | Admitting: Allergy and Immunology

## 2020-04-07 ENCOUNTER — Other Ambulatory Visit: Payer: Self-pay | Admitting: Allergy and Immunology

## 2020-04-10 ENCOUNTER — Telehealth: Payer: Self-pay | Admitting: Pulmonary Disease

## 2020-04-10 NOTE — Telephone Encounter (Signed)
Primary Pulmonologist: [facility] Last office visit and with whom: 02/25/20 What do we see them for (pulmonary problems): Covid-19 Last OV assessment/plan:  Assessment & Plan:   COVID 19 Pna: July 2021 while visiting at this time.  Was in the hospital for many days, on BiPAP, high flow.  Chest imaging with diffuse significant bilateral infiltrates.  Overall seems much improved.  Will order chest x-ray today to establish new imaging baseline as it has been 6 months or greater since onset of symptoms and hospitalization.  Asthma: Being seen by allergy and immunology.  Notes and plan reviewed as above.  I agree with ongoing plan and think he would benefit from [place] given ongoing cough.  This is being coordinated by his allergist.  Encouraged him to continue follow-up and continue with the plan he is on.  He is to continue all inhalers, oral medicines as prescribed.   Return in about 3 months (around 05/24/2020).   [name], MD 02/29/2020         Patient Instructions by [name], MD at 02/25/2020 4:00 PM  Author: [name], MD Author Type: Physician Filed: 02/25/2020 4:44 PM  Note Status: Signed Cosign: Cosign Not Required Encounter Date: 02/25/2020  Editor: [name], MD (Physician)               Nice to meet you  I agree with the allergy doctors plan.  Continue all of the inhalers and medicines you are on.  I agree with the injectable medicine.  I am optimistic it will help with your cough and your wheeze.  We will need a chest x-ray today to establish new baseline after Covid infection in the summer 2021.  I am glad you are doing much better.  Return to clinic for follow-up with Dr. Silas Flood in 3 months        Instructions     Return in about 3 months (around 05/24/2020).  Nice to meet you  I agree with the allergy doctors plan.  Continue all of the inhalers and medicines you are on.  I agree with the injectable  medicine.  I am optimistic it will help with your cough and your wheeze.  We will need a chest x-ray today to establish new baseline after Covid infection in the summer 2021.  I am glad you are doing much better.  Return to clinic for follow-up with Dr. Silas Flood in 3 months       Reason for call:  Assessment & Plan:   COVID 19 Pna: July 2021 while visiting at this time.  Was in the hospital for many days, on BiPAP, high flow.  Chest imaging with diffuse significant bilateral infiltrates.  Overall seems much improved.  Will order chest x-ray today to establish new imaging baseline as it has been 6 months or greater since onset of symptoms and hospitalization.  Asthma: Being seen by allergy and immunology.  Notes and plan reviewed as above.  I agree with ongoing plan and think he would benefit from [place] given ongoing cough.  This is being coordinated by his allergist.  Encouraged him to continue follow-up and continue with the plan he is on.  He is to continue all inhalers, oral medicines as prescribed.   Return in about 3 months (around 05/24/2020).   [name], MD 02/29/2020         Patient Instructions by [name], MD at 02/25/2020 4:00 PM  Author: [name], MD Author  Type: Physician Filed: 02/25/2020 4:44 PM  Note Status: Signed Cosign: Cosign Not Required Encounter Date: 02/25/2020  Editor: [name], MD (Physician)               Nice to meet you  I agree with the allergy doctors plan.  Continue all of the inhalers and medicines you are on.  I agree with the injectable medicine.  I am optimistic it will help with your cough and your wheeze.  We will need a chest x-ray today to establish new baseline after Covid infection in the summer 2021.  I am glad you are doing much better.  Return to clinic for follow-up with Dr. Silas Flood in 3 months        Instructions     Return in about 3 months (around  05/24/2020).  Nice to meet you  I agree with the allergy doctors plan.  Continue all of the inhalers and medicines you are on.  I agree with the injectable medicine.  I am optimistic it will help with your cough and your wheeze.  We will need a chest x-ray today to establish new baseline after Covid infection in the summer 2021.  I am glad you are doing much better.  Return to clinic for follow-up with Dr. Silas Flood in 3 months     Reason for call:  Patient has been coughing for 3 weeks.  His pcp put him on an antibiotic on 03/28/20 and it did not help with the cough at all.  He coughs during the day and at night, it wakes him up at night and he is unable to sleep.  It is mostly a dry cough, at times he coughs until he vomits green mucous.  He is wheezing at night as well.  He has a lot of chest congestion.  He denies any fever, chills, body aches, sick contacts.  He is fully vaccinated for covid including booster. He has tried Musinex DM OTC.  Dr. Melvyn Novas, please advise.  Thank you.  (examples of things to ask: : When did symptoms start? Fever? Cough? Productive? Color to sputum? More sputum than usual? Wheezing? Have you needed increased oxygen? Are you taking your respiratory medications? What over the counter measures have you tried?)  No Known Allergies   There is no immunization history on file for this patient.

## 2020-04-10 NOTE — Telephone Encounter (Signed)
Probably due to the acei inhibitor (Lotrel) but can't make that dx over the phone - will need ov with all meds in hand to see [place] or PCP to address this - no other options for phone care at this point

## 2020-04-10 NOTE — Telephone Encounter (Signed)
Called and spoke with patient. He will follow up with his PCP on the blood pressure medication. He still wanted an appt with our office. He has been scheduled to see Beth on 04/12/20 at 330pm.   Nothing further needed at time of call.

## 2020-04-12 ENCOUNTER — Other Ambulatory Visit: Payer: Self-pay | Admitting: Allergy and Immunology

## 2020-04-12 ENCOUNTER — Ambulatory Visit (INDEPENDENT_AMBULATORY_CARE_PROVIDER_SITE_OTHER): Payer: Medicare Other | Admitting: Primary Care

## 2020-04-12 ENCOUNTER — Encounter: Payer: Self-pay | Admitting: Primary Care

## 2020-04-12 ENCOUNTER — Other Ambulatory Visit: Payer: Self-pay

## 2020-04-12 VITALS — BP 124/78 | HR 90 | Temp 97.9°F | Ht 70.0 in | Wt 195.0 lb

## 2020-04-12 DIAGNOSIS — J4541 Moderate persistent asthma with (acute) exacerbation: Secondary | ICD-10-CM

## 2020-04-12 DIAGNOSIS — R053 Chronic cough: Secondary | ICD-10-CM

## 2020-04-12 DIAGNOSIS — J301 Allergic rhinitis due to pollen: Secondary | ICD-10-CM | POA: Diagnosis not present

## 2020-04-12 DIAGNOSIS — J3089 Other allergic rhinitis: Secondary | ICD-10-CM

## 2020-04-12 MED ORDER — PREDNISONE 10 MG PO TABS: ORAL_TABLET | ORAL | 0 refills | Status: DC

## 2020-04-12 MED ORDER — BENZONATATE 200 MG PO CAPS: 200.0000 mg | ORAL_CAPSULE | Freq: Three times a day (TID) | ORAL | 1 refills | Status: DC | PRN

## 2020-04-12 NOTE — Patient Instructions (Addendum)
Recommendations: - Start Zyrtec (cetirizine) 10mg  daily- this is an over the counter antihistamine - Start prednisone taper - if glucose >300 notify PCP  - Continue Breztri two puffs twice a day  - Continue Singulair 10mg  at bedtime  - Continue mucinex 600mg  twice a day  - Use flutter valve three times a day   Orders: - Flutter valve  - Sputum culture (ordered)  RX: - Prednisone taper as directed  - Tessalon perles three times a day as needed for cough   Follow-up: - May with Dr. Silas Flood

## 2020-04-12 NOTE — Progress Notes (Signed)
VIALS EXP 04-12-21

## 2020-04-12 NOTE — Progress Notes (Signed)
Aeroallergen Immunotherapy    Patient Details  Name: Marten Iles  MRN: [id number]  Date of Birth: [birth year]   Order one of one   Vial Label: grass / tree   0.3 ml (Volume) BAU Concentration -- 7 Grass Mix* 100,000 ([address] [place], [place], [place], [place], [place], [place], [place])  0.1 ml (Volume) BAU Concentration -- [place] 10,000  0.1 ml (Volume) 1:20 Concentration -- [place]  0.2 ml (Volume) 1:10 Concentration -- Wendee Copp mix*  0.2 ml (Volume) 1:20 Concentration -- Elm Mix*  0.2 ml (Volume) 1:10 Concentration -- Oak, [place] mix*  0.2 ml (Volume) 1:10 Concentration -- Pecan Pollen    1.3 ml Extract Subtotal  3.7 ml Diluent  5.0 ml Maintenance Total    Final Concentration above is stated in weight/volume (wt/vol). Allergen units (AU/ml) biological units (BAU/ml). The total volume is 5 ml.    Schedule: B   Special Instructions: 1-2 times per week

## 2020-04-12 NOTE — Progress Notes (Signed)
_0  ID: Bari Edward, male    DOB: 1950-08-07, 70 y.o.   MRN: [id number]  Chief Complaint  Patient presents with  . Follow-up    Productive cough with green mucus starting 3 w ago, a lot of wheezing    Referring provider: Bonnita Nasuti, MD  HPI: 70 year old male, former smoker quit 1985.  Past medical history significant for asthma, seasonal allergies, diabetes, hypertension, LPR Dr.  Patient of Dr. Silas Flood, seen for initial consult on 02/25/2020 for cough.  Patient had COVID-19 pneumonia in July 2021 requiring BiPAP.  Previous [facility] pulmonary encounter: 02/25/20 70 year old man whom we are seeing in consultation after COVID-19 pneumonia 07/2019 and for asthma.  Notes from allergist and immunologist extensively reviewed.  Overall, patient feels okay.  He feels like his asthma control is getting better over time.  He is on maximal inhaled therapy as well as oral montelukast and several medicines for rhino nasal allergy symptoms.  He still has a persistent cough.  Sometimes dry, sometimes productive.  Denies any postnasal drip.  Denies significant reflux.  No clear alleviating or exacerbating factors.  No environment works better or worse.  No timing during the day worse better or worse.  Patient was visiting being [place] in July 2021.  He developed COVID-19 infection.  He was hospitalized for several days per his report.  He did require BiPAP at some point time.  Never intubated.  He was discharged without oxygen.  Is taking a many months to come to get the strength back.  He feels like he is close now but not quite.  He brought physical films with him from the hospital in [place].  These were personally reviewed including chest x-rays x2 as well as CT scan.  Both demonstrate bilateral interstitial and alveolar filling infiltrates better demonstrated on CT scan with predilection for peribronchovascular areas which are quite dense on my interpretation.  PMH: Asthma, seasonal allergies,  severe COVID-19 pneumonia 07/2019 on BiPAP never intubated Surgical history: Cataract, hernia surgery Family history: Mother with asthma, stroke, sister with diabetes Social history: Former smoker, quit around 1990, plans to retire to [place] at some point, retired from previous job, lives in [place] / Pulmonary Flowsheets:   ACT:  Asthma Control Test ACT Total Score  05/12/2017 13  02/27/2017 19  01/17/2016 12    04/12/2020 Patient presents today for 1 month follow-up. Patient called our office on 04/10/20 with reports of cough keeping him up at night. His cough has been much worse over the last 3 weeks. He is getting up green purulent mucus. Associated fatigue and wheezing. PCP put him on azithromycin which did not help with cough. CXR on 02/27/20 showed new slight prominence parahilar interstitial markings. Needs repeat CXR in May 2022. He was started on Fasenra and given sample during his appointment with his Allergist on 04/06/20. He will be receiving his next injection on April 7th.  He will also be starting course of immunotherapy. He checks his blood sugars daily, running 120-160.    No Known Allergies  Immunization History  Administered Date(s) Administered  . Influenza Split 12/11/2013, 09/09/2014, 01/12/2016  . Influenza, High Dose Seasonal PF 11/26/2017  . Influenza, Quadrivalent, Recombinant, Inj, Pf 12/15/2019  . Influenza,inj,Quad PF,6+ Mos 01/12/2016  . Influenza,inj,Quad PF,6-35 Mos 10/07/2016  . Influenza-Unspecified 12/11/2013, 09/09/2014  . Pneumococcal Conjugate-13 11/26/2017  . Pneumococcal Polysaccharide-23 07/13/2013    Past Medical History:  Diagnosis Date  . Asthma   . Diabetes ([place])   . Hypertension   .  LPRD (laryngopharyngeal reflux disease)     Tobacco History: Social History   Tobacco Use  Smoking Status Former Smoker  . Quit date: 01/14/1973  . Years since quitting: 47.2  Smokeless Tobacco Never Used   Counseling given: Not  Answered   Outpatient Medications Prior to Visit  Medication Sig Dispense Refill  . albuterol (PROAIR HFA) 108 (90 Base) MCG/ACT inhaler Can use two puffs every four to six hours as needed for cough or wheeze. 8.5 g 1  . amLODipine-benazepril (LOTREL) 10-20 MG capsule Take 1 capsule by mouth daily.     Marland Kitchen aspirin EC 81 MG tablet Take 81 mg by mouth daily.     Marland Kitchen atorvastatin (LIPITOR) 40 MG tablet Take 40 mg by mouth daily.    . Budeson-Glycopyrrol-Formoterol (BREZTRI AEROSPHERE) 160-9-4.8 MCG/ACT AERO Inhale 2 puffs into the lungs in the morning and at bedtime. Use with spacer.  Rinse, gargle, and spit after use. 10.7 g 5  . doxazosin (CARDURA) 2 MG tablet Take 2 mg by mouth daily.    Noelle Penner ALLERGY RELIEF, CETIRIZINE, 10 MG tablet TAKE 1 TABLET BY MOUTH ONCE DAILY AS NEEDED 90 tablet 0  . esomeprazole (NEXIUM) 40 MG capsule Take one capsule twice daily as directed 180 capsule 1  . famotidine (PEPCID) 40 MG tablet Take 1 tablet (40 mg total) by mouth at bedtime. 30 tablet 5  . fluticasone (FLONASE) 50 MCG/ACT nasal spray Use one to two sprays in each nostril once daily as directed. 48 g 1  . hydrochlorothiazide (HYDRODIURIL) 25 MG tablet Take 25 mg by mouth daily.     Marland Kitchen ibuprofen (ADVIL,MOTRIN) 800 MG tablet Take 800 mg by mouth every 8 (eight) hours as needed. for pain    . JARDIANCE 10 MG TABS tablet Take 10 mg by mouth daily.    . metFORMIN (GLUCOPHAGE) 1000 MG tablet 1,000 mg. Take two tablets once daily as directed.    . mometasone (ELOCON) 0.1 % ointment Apply to hands 1 time per day 45 g 5  . montelukast (SINGULAIR) 10 MG tablet Take 1 tablet by mouth once daily 90 tablet 0  . Multiple Vitamins-Minerals (MULTIVITAMIN ADULT PO) Take by mouth daily.    . Olopatadine HCl (PATADAY) 0.2 % SOLN Use one drop in each eye once daily if needed 2.5 mL 5  . Tadalafil 2.5 MG TABS Take by mouth.     No facility-administered medications prior to visit.   Review of Systems  Review of Systems   Constitutional: Positive for fatigue.  HENT: Negative.   Respiratory: Positive for cough and wheezing. Negative for chest tightness and shortness of breath.     Physical Exam  BP 124/78 (BP Location: Left Arm, Cuff Size: Normal)   Pulse 90   Temp 97.9 F (36.6 C)   Ht 5' 10" (1.778 m)   Wt 195 lb (88.5 kg)   SpO2 98%   BMI 27.98 kg/m  Physical Exam Constitutional:      General: He is not in acute distress.    Appearance: Normal appearance. He is not ill-appearing.  HENT:     Mouth/Throat:     Comments: Deferred d/t masking Cardiovascular:     Rate and Rhythm: Normal rate and regular rhythm.  Pulmonary:     Effort: Pulmonary effort is normal.     Breath sounds: Wheezing present.     Comments: Inspiratory wheeze  Musculoskeletal:        General: Normal range of motion.  Skin:  General: Skin is warm and dry.  Neurological:     General: No focal deficit present.     Mental Status: He is alert and oriented to person, place, and time. Mental status is at baseline.  Psychiatric:        Mood and Affect: Mood normal.        Behavior: Behavior normal.        Thought Content: Thought content normal.        Judgment: Judgment normal.      Lab Results:  CBC    Component Value Date/Time   WBC 8.5 12/13/2019 1653   RBC 5.89 (H) 12/13/2019 1653   HGB 16.3 12/13/2019 1653   HCT 49.3 12/13/2019 1653   MCV 84 12/13/2019 1653   MCH 27.7 12/13/2019 1653   MCHC 33.1 12/13/2019 1653   RDW 14.1 12/13/2019 1653   LYMPHSABS 1.9 12/13/2019 1653   EOSABS 0.6 (H) 12/13/2019 1653   BASOSABS 0.1 12/13/2019 1653    BMET    Component Value Date/Time   NA 138 12/03/2007 0845   K 4.8 12/03/2007 0845   CL 103 12/03/2007 0845   CO2 27 12/03/2007 0845   GLUCOSE 112 (H) 12/03/2007 0845   BUN 21 12/03/2007 0845   CREATININE 1.20 12/03/2007 0845   CALCIUM 8.9 12/03/2007 0845   GFRNONAA >60 12/03/2007 0845   GFRAA  12/03/2007 0845    >60        The eGFR has been  calculated using the MDRD equation. This calculation has not been validated in all clinical    BNP No results found for: BNP  ProBNP No results found for: PROBNP  Imaging: No results found.   Assessment & Plan:   Asthma exacerbation Patient has a persistent cough with associated fatigue and wheezing. NO better with course of azithromycin. Started on Fasenra 04/06/20. Following with allergy. He had inspiratory wheezing on exam. Needs prednisone taper (62m x 2 days; 366mx 2 days; 2022m 2 days; 31m81m2 days). Continue Breztri aerosphere two puffs twice daily. Start flutter valve three times a day for cough. We will check a sputum sample. Advised patient monitor BS and if >300 notify PCP.   Recommendations: - Start Zyrtec (cetirizine) 31mg71mly - Start prednisone taper as directed - Continue Breztri two puffs twice a day  - Continue Singulair 31mg 75medtime  - Continue mucinex 600mg t74m a day  - Use flutter valve three times a day     ElizabeMartyn Ehrich31/2022

## 2020-04-13 ENCOUNTER — Encounter: Payer: Self-pay | Admitting: Primary Care

## 2020-04-13 ENCOUNTER — Other Ambulatory Visit: Payer: Medicare Other

## 2020-04-13 DIAGNOSIS — R053 Chronic cough: Secondary | ICD-10-CM

## 2020-04-13 DIAGNOSIS — J45901 Unspecified asthma with (acute) exacerbation: Secondary | ICD-10-CM | POA: Insufficient documentation

## 2020-04-13 NOTE — Assessment & Plan Note (Addendum)
Patient has a persistent cough with associated fatigue and wheezing. NO better with course of azithromycin. Started on Fasenra 04/06/20. Following with allergy. He had inspiratory wheezing on exam. Needs prednisone taper (40mg  x 2 days; 30mg  x 2 days; 20mg  x 2 days; 10mg  x 2 days). Continue Breztri aerosphere two puffs twice daily. Start flutter valve three times a day for cough. We will check a sputum sample. Advised patient monitor BS and if >300 notify PCP.   Recommendations: - Start Zyrtec (cetirizine) 10mg  daily - Start prednisone taper as directed - Continue Breztri two puffs twice a day  - Continue Singulair 10mg  at bedtime  - Continue mucinex 600mg  twice a day  - Use flutter valve three times a day

## 2020-04-14 LAB — RESPIRATORY CULTURE OR RESPIRATORY AND SPUTUM CULTURE: MICRO NUMBER:: 11716093

## 2020-04-18 NOTE — Progress Notes (Signed)
Please let patient now sputum culture showed oropharyngeal contamination. Please resubmit another specimen if able

## 2020-04-20 ENCOUNTER — Ambulatory Visit (INDEPENDENT_AMBULATORY_CARE_PROVIDER_SITE_OTHER): Payer: Medicare Other | Admitting: *Deleted

## 2020-04-20 ENCOUNTER — Other Ambulatory Visit: Payer: Self-pay

## 2020-04-20 DIAGNOSIS — J309 Allergic rhinitis, unspecified: Secondary | ICD-10-CM

## 2020-04-20 NOTE — Progress Notes (Signed)
Immunotherapy   Patient Details  Name: Bobby Wheeler MRN: [id number] Date of Birth: [birth year]  04/20/2020  Bobby Wheeler  Following schedule: B Frequency: WEEKLY Epi-Pen: YES Consent signed and patient instructions given.   Glendell Docker 04/20/2020, 3:47 PM

## 2020-04-27 ENCOUNTER — Ambulatory Visit (INDEPENDENT_AMBULATORY_CARE_PROVIDER_SITE_OTHER): Payer: Medicare Other | Admitting: *Deleted

## 2020-04-27 DIAGNOSIS — J309 Allergic rhinitis, unspecified: Secondary | ICD-10-CM | POA: Diagnosis not present

## 2020-05-01 ENCOUNTER — Other Ambulatory Visit: Payer: Self-pay | Admitting: Allergy and Immunology

## 2020-05-04 ENCOUNTER — Ambulatory Visit (INDEPENDENT_AMBULATORY_CARE_PROVIDER_SITE_OTHER): Payer: Medicare Other

## 2020-05-04 DIAGNOSIS — J309 Allergic rhinitis, unspecified: Secondary | ICD-10-CM

## 2020-05-11 ENCOUNTER — Ambulatory Visit (INDEPENDENT_AMBULATORY_CARE_PROVIDER_SITE_OTHER): Payer: Medicare Other | Admitting: *Deleted

## 2020-05-11 DIAGNOSIS — J309 Allergic rhinitis, unspecified: Secondary | ICD-10-CM | POA: Diagnosis not present

## 2020-05-18 ENCOUNTER — Ambulatory Visit (INDEPENDENT_AMBULATORY_CARE_PROVIDER_SITE_OTHER): Payer: Medicare Other | Admitting: *Deleted

## 2020-05-18 DIAGNOSIS — J309 Allergic rhinitis, unspecified: Secondary | ICD-10-CM

## 2020-05-25 ENCOUNTER — Ambulatory Visit (INDEPENDENT_AMBULATORY_CARE_PROVIDER_SITE_OTHER): Payer: Medicare Other | Admitting: *Deleted

## 2020-05-25 DIAGNOSIS — J309 Allergic rhinitis, unspecified: Secondary | ICD-10-CM | POA: Diagnosis not present

## 2020-05-26 ENCOUNTER — Other Ambulatory Visit: Payer: Self-pay | Admitting: Allergy and Immunology

## 2020-06-01 ENCOUNTER — Ambulatory Visit (INDEPENDENT_AMBULATORY_CARE_PROVIDER_SITE_OTHER): Payer: Medicare Other | Admitting: *Deleted

## 2020-06-01 DIAGNOSIS — J309 Allergic rhinitis, unspecified: Secondary | ICD-10-CM

## 2020-06-13 ENCOUNTER — Ambulatory Visit (INDEPENDENT_AMBULATORY_CARE_PROVIDER_SITE_OTHER): Payer: Medicare Other

## 2020-06-13 DIAGNOSIS — J309 Allergic rhinitis, unspecified: Secondary | ICD-10-CM | POA: Diagnosis not present

## 2020-06-21 ENCOUNTER — Ambulatory Visit (INDEPENDENT_AMBULATORY_CARE_PROVIDER_SITE_OTHER): Payer: Medicare Other | Admitting: *Deleted

## 2020-06-21 DIAGNOSIS — J309 Allergic rhinitis, unspecified: Secondary | ICD-10-CM | POA: Diagnosis not present

## 2020-06-28 ENCOUNTER — Ambulatory Visit (INDEPENDENT_AMBULATORY_CARE_PROVIDER_SITE_OTHER): Payer: Medicare Other | Admitting: *Deleted

## 2020-06-28 DIAGNOSIS — J309 Allergic rhinitis, unspecified: Secondary | ICD-10-CM | POA: Diagnosis not present

## 2020-07-05 ENCOUNTER — Ambulatory Visit (INDEPENDENT_AMBULATORY_CARE_PROVIDER_SITE_OTHER): Payer: Medicare Other | Admitting: *Deleted

## 2020-07-05 DIAGNOSIS — J309 Allergic rhinitis, unspecified: Secondary | ICD-10-CM | POA: Diagnosis not present

## 2020-07-12 ENCOUNTER — Ambulatory Visit (INDEPENDENT_AMBULATORY_CARE_PROVIDER_SITE_OTHER): Payer: Medicare Other | Admitting: *Deleted

## 2020-07-12 DIAGNOSIS — J309 Allergic rhinitis, unspecified: Secondary | ICD-10-CM | POA: Diagnosis not present

## 2020-07-20 ENCOUNTER — Ambulatory Visit (INDEPENDENT_AMBULATORY_CARE_PROVIDER_SITE_OTHER): Payer: Medicare Other | Admitting: *Deleted

## 2020-07-20 DIAGNOSIS — J309 Allergic rhinitis, unspecified: Secondary | ICD-10-CM

## 2020-07-26 ENCOUNTER — Ambulatory Visit (INDEPENDENT_AMBULATORY_CARE_PROVIDER_SITE_OTHER): Payer: Medicare Other | Admitting: *Deleted

## 2020-07-26 DIAGNOSIS — J309 Allergic rhinitis, unspecified: Secondary | ICD-10-CM | POA: Diagnosis not present

## 2020-07-31 ENCOUNTER — Other Ambulatory Visit: Payer: Self-pay | Admitting: Allergy and Immunology

## 2020-08-02 ENCOUNTER — Ambulatory Visit (INDEPENDENT_AMBULATORY_CARE_PROVIDER_SITE_OTHER): Payer: Medicare Other | Admitting: *Deleted

## 2020-08-02 DIAGNOSIS — J309 Allergic rhinitis, unspecified: Secondary | ICD-10-CM | POA: Diagnosis not present

## 2020-08-08 ENCOUNTER — Encounter: Payer: Self-pay | Admitting: Pulmonary Disease

## 2020-08-08 ENCOUNTER — Ambulatory Visit: Payer: Medicare Other | Admitting: Pulmonary Disease

## 2020-08-08 ENCOUNTER — Ambulatory Visit (INDEPENDENT_AMBULATORY_CARE_PROVIDER_SITE_OTHER): Payer: Medicare Other | Admitting: Pulmonary Disease

## 2020-08-08 ENCOUNTER — Ambulatory Visit (INDEPENDENT_AMBULATORY_CARE_PROVIDER_SITE_OTHER): Payer: Medicare Other

## 2020-08-08 ENCOUNTER — Other Ambulatory Visit: Payer: Self-pay

## 2020-08-08 VITALS — BP 102/68 | HR 79 | Ht 70.0 in | Wt 200.0 lb

## 2020-08-08 DIAGNOSIS — J4520 Mild intermittent asthma, uncomplicated: Secondary | ICD-10-CM | POA: Diagnosis not present

## 2020-08-08 DIAGNOSIS — J1282 Pneumonia due to coronavirus disease 2019: Secondary | ICD-10-CM

## 2020-08-08 DIAGNOSIS — U071 COVID-19: Secondary | ICD-10-CM | POA: Diagnosis not present

## 2020-08-08 NOTE — Patient Instructions (Signed)
Nice to see you again  We will get a chest x-ray today.  We can compare this to the one in February.  We are approaching about 1 year since you had severe COVID.  The chest x-ray today will likely represent a new baseline and how your lung images appear on chest x-ray.  No medication changes.  Follow-up with Dr. Silas Flood in 6 months or sooner as needed.

## 2020-08-09 ENCOUNTER — Ambulatory Visit (INDEPENDENT_AMBULATORY_CARE_PROVIDER_SITE_OTHER): Payer: Medicare Other | Admitting: *Deleted

## 2020-08-09 DIAGNOSIS — J309 Allergic rhinitis, unspecified: Secondary | ICD-10-CM | POA: Diagnosis not present

## 2020-08-09 NOTE — Progress Notes (Signed)
'@Patient'  ID: Bobby Wheeler, male    DOB: Dec 27, 1950, 70 y.o.   MRN: [id number]  Chief Complaint  Patient presents with   Asthma    Coughing. Itching in throat inhaler working good.    Referring provider: Bonnita Nasuti, MD  HPI:   70 year old man whom we are seeing in consultation after COVID-19 pneumonia 07/2019 and for asthma.  Most recent note from allergist and immunologist reviewed. Most recent pulmonary note 04/13/20 from Derl Barrow NP reviewed.  Overall doing well.  Breathing at baseline.  At time of last evaluation by me he was being worked up by his allergy and immunology doctor to start [place].  He started this in the spring 2022.  Shortly after starting it seems that he had asthma exacerbation.  Seen by our office, Derl Barrow, NP.  Given prednisone taper.  This greatly improved symptoms, improved cough and dyspnea exertion.  Since then cough and dyspnea of been better.  Cough in particular better.  Berna Bue very much helping.  Does have some residual cough but overall less bothersome, does not feel like further intervention is necessary.  We reviewed his chest x-ray obtained at last visit and the monitor patient reveals scattered coarse interstitial markings mildly increased from normal on my interpretation likely related to prior COVID-19 infection 09/2019.  He was hospitalized and BiPAP dependent in [place] at the time.  He is eager to repeat chest imaging.  Explained that these had plenty of time for lungs to heal.  New chest x-ray obtained today would be new baseline.  Would not be surprised if there is some mild interstitial or fibrotic changes seen.  He expressed understanding.  HPI initial visit: Overall, patient feels okay.  He feels like his asthma control is getting better over time.  He is on maximal inhaled therapy as well as oral montelukast and several medicines for rhino nasal allergy symptoms.  He still has a persistent cough.  Sometimes dry, sometimes productive.  Denies  any postnasal drip.  Denies significant reflux.  No clear alleviating or exacerbating factors.  No environment works better or worse.  No timing during the day worse better or worse.  Patient was visiting being [place] in July 2021.  He developed COVID-19 infection.  He was hospitalized for several days per his report.  He did require BiPAP at some point time.  Never intubated.  He was discharged without oxygen.  Is taking a many months to come to get the strength back.  He feels like he is close now but not quite.  He brought physical films with him from the hospital in [place].  These were personally reviewed including chest x-rays x2 as well as CT scan.  Both demonstrate bilateral interstitial and alveolar filling infiltrates better demonstrated on CT scan with predilection for peribronchovascular areas which are quite dense on my interpretation.  PMH: Asthma, seasonal allergies, severe COVID-19 pneumonia 07/2019 on BiPAP never intubated Surgical history: Cataract, hernia surgery Family history: Mother with asthma, stroke, sister with diabetes Social history: Former smoker, quit around 1990, plans to retire to [place] at some point, retired from previous job, lives in [place] / Pulmonary Flowsheets:   ACT:  Asthma Control Test ACT Total Score  08/08/2020 19  04/12/2020 17  05/12/2017 13    MMRC: No flowsheet data found.  Epworth:  No flowsheet data found.  Tests:   FENO:  No results found for: NITRICOXIDE  PFT: No flowsheet data found.  WALK:  No flowsheet data  found.  Imaging: Personally reviewed films he brought from [place], please see above No results found.   Lab Results: Personally reviewed, eosinophils persistently elevated CBC    Component Value Date/Time   WBC 8.5 12/13/2019 1653   RBC 5.89 (H) 12/13/2019 1653   HGB 16.3 12/13/2019 1653   HCT 49.3 12/13/2019 1653   MCV 84 12/13/2019 1653   MCH 27.7 12/13/2019 1653   MCHC 33.1 12/13/2019  1653   RDW 14.1 12/13/2019 1653   LYMPHSABS 1.9 12/13/2019 1653   EOSABS 0.6 (H) 12/13/2019 1653   BASOSABS 0.1 12/13/2019 1653    BMET    Component Value Date/Time   NA 138 12/03/2007 0845   K 4.8 12/03/2007 0845   CL 103 12/03/2007 0845   CO2 27 12/03/2007 0845   GLUCOSE 112 (H) 12/03/2007 0845   BUN 21 12/03/2007 0845   CREATININE 1.20 12/03/2007 0845   CALCIUM 8.9 12/03/2007 0845   GFRNONAA >60 12/03/2007 0845   GFRAA  12/03/2007 0845    >60        The eGFR has been calculated using the MDRD equation. This calculation has not been validated in all clinical    BNP No results found for: BNP  ProBNP No results found for: PROBNP  Specialty Problems       Pulmonary Problems   Asthma exacerbation    No Known Allergies  Immunization History  Administered Date(s) Administered   Influenza Split 12/11/2013, 09/09/2014, 01/12/2016   Influenza, High Dose Seasonal PF 11/26/2017   Influenza, Quadrivalent, Recombinant, Inj, Pf 12/15/2019   Influenza,inj,Quad PF,6+ Mos 01/12/2016   Influenza,inj,Quad PF,6-35 Mos 10/07/2016   Influenza-Unspecified 12/11/2013, 09/09/2014   Pneumococcal Conjugate-13 11/26/2017   Pneumococcal Polysaccharide-23 07/13/2013    Past Medical History:  Diagnosis Date   Asthma    Diabetes ([place])    Hypertension    LPRD (laryngopharyngeal reflux disease)     Tobacco History: Social History   Tobacco Use  Smoking Status Former   Types: Cigarettes   Start date: 1967   Quit date: 01/14/1973   Years since quitting: 47.6  Smokeless Tobacco Never   Counseling given: Not Answered   Continue to not smoke  Outpatient Encounter Medications as of 08/08/2020  Medication Sig   albuterol (PROAIR HFA) 108 (90 Base) MCG/ACT inhaler Can use two puffs every four to six hours as needed for cough or wheeze.   amLODipine-benazepril (LOTREL) 10-20 MG capsule Take 1 capsule by mouth daily.    aspirin EC 81 MG tablet Take 81 mg by mouth daily.     atorvastatin (LIPITOR) 40 MG tablet Take 40 mg by mouth daily.   benzonatate (TESSALON) 200 MG capsule Take 1 capsule (200 mg total) by mouth 3 (three) times daily as needed for cough.   Budeson-Glycopyrrol-Formoterol (BREZTRI AEROSPHERE) 160-9-4.8 MCG/ACT AERO Inhale 2 puffs into the lungs in the morning and at bedtime. Use with spacer.  Rinse, gargle, and spit after use.   doxazosin (CARDURA) 2 MG tablet Take 2 mg by mouth daily.   EQ ALLERGY RELIEF, CETIRIZINE, 10 MG tablet TAKE 1 TABLET BY MOUTH ONCE DAILY AS NEEDED   esomeprazole (NEXIUM) 40 MG capsule TAKE 1 CAPSULE BY MOUTH TWICE DAILY AS DIRECTED   famotidine (PEPCID) 40 MG tablet TAKE 1 TABLET BY MOUTH AT BEDTIME   fluticasone (FLONASE) 50 MCG/ACT nasal spray Use one to two sprays in each nostril once daily as directed.   hydrochlorothiazide (HYDRODIURIL) 25 MG tablet Take 25 mg by mouth daily.  ibuprofen (ADVIL,MOTRIN) 800 MG tablet Take 800 mg by mouth every 8 (eight) hours as needed. for pain   JARDIANCE 10 MG TABS tablet Take 10 mg by mouth daily.   metFORMIN (GLUCOPHAGE) 1000 MG tablet 1,000 mg. Take two tablets once daily as directed.   mometasone (ELOCON) 0.1 % ointment Apply to hands 1 time per day   montelukast (SINGULAIR) 10 MG tablet Take 1 tablet by mouth once daily   Multiple Vitamins-Minerals (MULTIVITAMIN ADULT PO) Take by mouth daily.   Olopatadine HCl (PATADAY) 0.2 % SOLN Use one drop in each eye once daily if needed   Tadalafil 2.5 MG TABS Take by mouth.   predniSONE (DELTASONE) 10 MG tablet Take 4 tabs po daily x 2 days; then 3 tabs for 2 days; then 2 tabs for 2 days; then 1 tab for 2 days (Patient not taking: Reported on 08/08/2020)   No facility-administered encounter medications on file as of 08/08/2020.     Review of Systems  Review of Systems  N/A Physical Exam  BP 102/68   Pulse 79   Ht '5\' 10"'  (1.778 m)   Wt 200 lb (90.7 kg)   SpO2 98%   BMI 28.70 kg/m   Wt Readings from Last 5 Encounters:   08/08/20 200 lb (90.7 kg)  04/12/20 195 lb (88.5 kg)  02/25/20 201 lb (91.2 kg)  02/03/20 204 lb 12.8 oz (92.9 kg)  04/15/19 196 lb 3.2 oz (89 kg)    BMI Readings from Last 5 Encounters:  08/08/20 28.70 kg/m  04/12/20 27.98 kg/m  02/25/20 29.68 kg/m  02/03/20 30.24 kg/m  04/15/19 28.97 kg/m     Physical Exam General: Well-appearing, in no acute distress Eyes: EOMI, icterus Neck: Supple, no JVP Cardiovascular: Regular rhythm, no murmurs Pulmonary: Clear to auscultate bilaterally, normal work of breathing Abdomen: Mild distention, bowel sounds present MSK: No synovitis, joint effusion Neuro: Normal gait, no weakness Psych: No mood, full affect   Assessment & Plan:   COVID 19 Pna: July 2021 while visiting at this time.  Was in the hospital for many days, on BiPAP, high flow.  Chest imaging with diffuse significant bilateral infiltrates.  Overall symptoms much improved.  Chest x-ray 2/21 with mild increased interstitial markings likely mild fibrosis on my interpretation.  Repeat chest x-ray today to establish formal baseline after COVID-19 infection.  Asthma: Being seen by allergy and immunology.  Exacerbation 03/2020 treated with prednisone with great improvement.  Symptoms markedly improved with the initiation of Fasenra being directed by allergy and immunology.  He continues on Breztri, montelukast. I have no further recommendations.  Encouraged to continue follow-up with allergy and allergy.   Return in about 6 months (around 02/08/2021).   Bobby Clam, MD 08/09/2020

## 2020-08-17 ENCOUNTER — Ambulatory Visit (INDEPENDENT_AMBULATORY_CARE_PROVIDER_SITE_OTHER): Payer: Medicare Other | Admitting: *Deleted

## 2020-08-17 DIAGNOSIS — J309 Allergic rhinitis, unspecified: Secondary | ICD-10-CM

## 2020-08-23 ENCOUNTER — Ambulatory Visit (INDEPENDENT_AMBULATORY_CARE_PROVIDER_SITE_OTHER): Payer: Medicare Other | Admitting: *Deleted

## 2020-08-23 DIAGNOSIS — J309 Allergic rhinitis, unspecified: Secondary | ICD-10-CM

## 2020-08-30 ENCOUNTER — Ambulatory Visit (INDEPENDENT_AMBULATORY_CARE_PROVIDER_SITE_OTHER): Payer: Medicare Other | Admitting: *Deleted

## 2020-08-30 DIAGNOSIS — J309 Allergic rhinitis, unspecified: Secondary | ICD-10-CM | POA: Diagnosis not present

## 2020-09-06 ENCOUNTER — Ambulatory Visit (INDEPENDENT_AMBULATORY_CARE_PROVIDER_SITE_OTHER): Payer: Medicare Other | Admitting: *Deleted

## 2020-09-06 DIAGNOSIS — J309 Allergic rhinitis, unspecified: Secondary | ICD-10-CM | POA: Diagnosis not present

## 2020-09-12 ENCOUNTER — Other Ambulatory Visit: Payer: Self-pay | Admitting: Allergy and Immunology

## 2020-09-13 ENCOUNTER — Ambulatory Visit (INDEPENDENT_AMBULATORY_CARE_PROVIDER_SITE_OTHER): Payer: Medicare Other | Admitting: *Deleted

## 2020-09-13 DIAGNOSIS — J309 Allergic rhinitis, unspecified: Secondary | ICD-10-CM

## 2020-11-16 ENCOUNTER — Ambulatory Visit (INDEPENDENT_AMBULATORY_CARE_PROVIDER_SITE_OTHER): Payer: Medicare Other | Admitting: Allergy and Immunology

## 2020-11-16 ENCOUNTER — Encounter: Payer: Self-pay | Admitting: Allergy and Immunology

## 2020-11-16 ENCOUNTER — Other Ambulatory Visit: Payer: Self-pay

## 2020-11-16 VITALS — BP 118/78 | HR 76 | Resp 16

## 2020-11-16 DIAGNOSIS — J3089 Other allergic rhinitis: Secondary | ICD-10-CM

## 2020-11-16 DIAGNOSIS — H6981 Other specified disorders of Eustachian tube, right ear: Secondary | ICD-10-CM | POA: Diagnosis not present

## 2020-11-16 DIAGNOSIS — J4550 Severe persistent asthma, uncomplicated: Secondary | ICD-10-CM

## 2020-11-16 DIAGNOSIS — K219 Gastro-esophageal reflux disease without esophagitis: Secondary | ICD-10-CM | POA: Diagnosis not present

## 2020-11-16 DIAGNOSIS — L309 Dermatitis, unspecified: Secondary | ICD-10-CM | POA: Diagnosis not present

## 2020-11-16 DIAGNOSIS — J309 Allergic rhinitis, unspecified: Secondary | ICD-10-CM | POA: Diagnosis not present

## 2020-11-16 NOTE — Patient Instructions (Addendum)
  1.  Continue benralizumab injections every 8 weeks   2.  Continue immunotherapy   3.  Continue Breztri - 2 inhalations 1-2 times per day with spacer   4.  Continue montelukast 10 mg one tablet one time per day  5. Continue Fluticasone 1-2 puffs each nostril one time per day  6. Continue generic Nexium 40mg  1-2 times per day  7. Continue Famotidine 40 mg - 1 tablet at bedtime  8. Can use the following if needed:    A.  Proventil HFA or similar  B.  Cetirizine 10 mg 1 tablet 1 time per day  C.  Pataday 1 drop each eye 1 time per day  D.  Mometasone 0.1% ointment applied to hands 1 time per day  9.  Obtain fall flu vaccine  10. Return to clinic in 6 months or earlier if problem  11. Visit with ENT about right ear

## 2020-11-16 NOTE — Progress Notes (Signed)
[facility] [place] [facility] [place]   Follow-up Note  Referring Provider: Bonnita Nasuti, MD Primary Provider: Bonnita Nasuti, MD Date of Office Visit: 11/16/2020  Subjective:   Bobby Wheeler (DOB: [birth year]) is a 70 y.o. male who returns to the [facility] [place] on 11/16/2020 in re-evaluation of the following:  HPI: Bobby Wheeler returns to this clinic in evaluation of severe asthma, allergic rhinoconjunctivitis, and reflux and history of hand eczema.  His last visit to this clinic was 06 April 2020.  During his last visit we started him on benralizumab injections and immunotherapy.  He has not had an adverse effect from either 1 of these medications.  He has dramatically improved regarding his asthma and has no need to use a short acting bronchodilator and can exercise without any difficulty while he continues to use his benralizumab injections and continues on Breztri just once a day at this point in time.  He has had very little problems with his nose while using nasal fluticasone and montelukast.  His reflux is under excellent control at this point in time on Nexium and famotidine.  He has not been having any hand eczema.  He has been having an issue with his right ear.  He feels as though it is "running" specially when he lays down at night.  He has had some decreased hearing.  He does not have any vertigo.  He might have some intermittent ringing.  Allergies as of 11/16/2020   No Known Allergies      Medication List    albuterol 108 (90 Base) MCG/ACT inhaler Commonly known as: ProAir HFA Can use two puffs every four to six hours as needed for cough or wheeze.   amLODipine-benazepril 10-20 MG capsule Commonly known as: LOTREL Take 1 capsule by mouth daily.   aspirin EC 81 MG tablet Take 81 mg by mouth daily.   atorvastatin 40 MG tablet Commonly known as: LIPITOR Take 40 mg by mouth daily.   Breztri Aerosphere 160-9-4.8 MCG/ACT  Aero Generic drug: Budeson-Glycopyrrol-Formoterol Inhale 2 puffs into the lungs in the morning and at bedtime. Use with spacer.  Rinse, gargle, and spit after use.   doxazosin 2 MG tablet Commonly known as: CARDURA Take 2 mg by mouth daily.   EQ Allergy Relief (Cetirizine) 10 MG tablet Generic drug: cetirizine TAKE 1 TABLET BY MOUTH ONCE DAILY AS NEEDED   esomeprazole 40 MG capsule Commonly known as: NEXIUM TAKE 1 CAPSULE BY MOUTH TWICE DAILY AS DIRECTED   famotidine 40 MG tablet Commonly known as: PEPCID TAKE 1 TABLET BY MOUTH AT BEDTIME   fluticasone 50 MCG/ACT nasal spray Commonly known as: FLONASE Use one to two sprays in each nostril once daily as directed.   hydrochlorothiazide 25 MG tablet Commonly known as: HYDRODIURIL Take 25 mg by mouth daily.   ibuprofen 800 MG tablet Commonly known as: ADVIL Take 800 mg by mouth every 8 (eight) hours as needed. for pain   Jardiance 10 MG Tabs tablet Generic drug: empagliflozin Take 10 mg by mouth daily.   metFORMIN 1000 MG tablet Commonly known as: GLUCOPHAGE 1,000 mg. Take two tablets once daily as directed.   mometasone 0.1 % ointment Commonly known as: ELOCON Apply to hands 1 time per day   montelukast 10 MG tablet Commonly known as: SINGULAIR Take 1 tablet by mouth once daily   MULTIVITAMIN ADULT PO Take by mouth daily.   Olopatadine HCl 0.2 % Soln Commonly known as:  Pataday Use one drop in each eye once daily if needed   Tadalafil 2.5 MG Tabs Take by mouth.    Past Medical History:  Diagnosis Date   Asthma    Diabetes ([place])    Hypertension    LPRD (laryngopharyngeal reflux disease)     Past Surgical History:  Procedure Laterality Date   CATARACT EXTRACTION  2020   HERNIA REPAIR      Review of systems negative except as noted in HPI / PMHx or noted below:  Review of Systems  Constitutional: Negative.   HENT: Negative.    Eyes: Negative.   Respiratory: Negative.    Cardiovascular:  Negative.   Gastrointestinal: Negative.   Genitourinary: Negative.   Musculoskeletal: Negative.   Skin: Negative.   Neurological: Negative.   Endo/Heme/Allergies: Negative.   Psychiatric/Behavioral: Negative.      Objective:   Vitals:   11/16/20 1618  BP: 118/78  Pulse: 76  Resp: 16  SpO2: 96%          Physical Exam Constitutional:      Appearance: He is not diaphoretic.  HENT:     Head: Normocephalic.     Right Ear: Tympanic membrane, ear canal and external ear normal.     Left Ear: Tympanic membrane, ear canal and external ear normal.     Nose: Nose normal. No mucosal edema or rhinorrhea.     Mouth/Throat:     Pharynx: Uvula midline. No oropharyngeal exudate.  Eyes:     Conjunctiva/sclera: Conjunctivae normal.  Neck:     Thyroid: No thyromegaly.     Trachea: Trachea normal. No tracheal tenderness or tracheal deviation.  Cardiovascular:     Rate and Rhythm: Normal rate and regular rhythm.     Heart sounds: Normal heart sounds, S1 normal and S2 normal. No murmur heard. Pulmonary:     Effort: No respiratory distress.     Breath sounds: Normal breath sounds. No stridor. No wheezing or rales.  Lymphadenopathy:     Head:     Right side of head: No tonsillar adenopathy.     Left side of head: No tonsillar adenopathy.     Cervical: No cervical adenopathy.  Skin:    Findings: No erythema or rash.     Nails: There is no clubbing.  Neurological:     Mental Status: He is alert.    Diagnostics:    Spirometry was performed and demonstrated an FEV1 of 2.21 at 75 % of predicted.  Assessment and Plan:   1. Asthma, severe persistent, well-controlled   2. Other allergic rhinitis   3. Hand eczema   4. LPRD (laryngopharyngeal reflux disease)   5. Dysfunction of right eustachian tube   6. Allergic rhinitis, unspecified seasonality, unspecified trigger     1.  Continue benralizumab injections every 8 weeks   2.  Continue immunotherapy   3.  Continue Breztri - 2  inhalations 1-2 times per day with spacer   4.  Continue montelukast 10 mg one tablet one time per day  5. Continue Fluticasone 1-2 puffs each nostril one time per day  6. Continue generic Nexium 40mg  1-2 times per day  7. Continue Famotidine 40 mg - 1 tablet at bedtime  8. Can use the following if needed:    A.  Proventil HFA or similar  B.  Cetirizine 10 mg 1 tablet 1 time per day  C.  Pataday 1 drop each eye 1 time per day  D.  Mometasone 0.1% ointment  applied to hands 1 time per day 9.  Obtain fall flu vaccine  10. Return to clinic in 6 months or earlier if problem  11. Visit with ENT about right ear  Beauford appears to be doing very well on his current plan which includes benralizumab injections, immunotherapy, anti-inflammatory agents for his airway and treatment directed against reflux.  He will continue on the plan noted above and we will see him back in this clinic in 6 months.  We will refer him onto ENT concerning his issues with his right ear.  Allena Katz, MD Allergy / Immunology [place]

## 2020-11-20 ENCOUNTER — Encounter: Payer: Self-pay | Admitting: Allergy and Immunology

## 2020-11-22 ENCOUNTER — Ambulatory Visit (INDEPENDENT_AMBULATORY_CARE_PROVIDER_SITE_OTHER): Payer: Medicare Other | Admitting: *Deleted

## 2020-11-22 DIAGNOSIS — J309 Allergic rhinitis, unspecified: Secondary | ICD-10-CM

## 2020-12-05 ENCOUNTER — Ambulatory Visit (INDEPENDENT_AMBULATORY_CARE_PROVIDER_SITE_OTHER): Payer: Medicare Other | Admitting: *Deleted

## 2020-12-05 DIAGNOSIS — J309 Allergic rhinitis, unspecified: Secondary | ICD-10-CM

## 2020-12-12 ENCOUNTER — Ambulatory Visit (INDEPENDENT_AMBULATORY_CARE_PROVIDER_SITE_OTHER): Payer: Medicare Other

## 2020-12-12 DIAGNOSIS — J309 Allergic rhinitis, unspecified: Secondary | ICD-10-CM

## 2020-12-13 ENCOUNTER — Other Ambulatory Visit: Payer: Self-pay

## 2020-12-13 ENCOUNTER — Other Ambulatory Visit: Payer: Self-pay | Admitting: *Deleted

## 2020-12-13 DIAGNOSIS — H6981 Other specified disorders of Eustachian tube, right ear: Secondary | ICD-10-CM

## 2020-12-13 MED ORDER — FASENRA PEN 30 MG/ML ~~LOC~~ SOAJ: 30.0000 mg | SUBCUTANEOUS | 6 refills | Status: DC

## 2020-12-14 NOTE — Telephone Encounter (Signed)
Spoke to [facility] at [facility] Rx and she will reach out to patient to send out

## 2020-12-19 ENCOUNTER — Ambulatory Visit (INDEPENDENT_AMBULATORY_CARE_PROVIDER_SITE_OTHER): Payer: Medicare Other | Admitting: *Deleted

## 2020-12-19 DIAGNOSIS — J309 Allergic rhinitis, unspecified: Secondary | ICD-10-CM | POA: Diagnosis not present

## 2020-12-26 ENCOUNTER — Ambulatory Visit (INDEPENDENT_AMBULATORY_CARE_PROVIDER_SITE_OTHER): Payer: Medicare Other

## 2020-12-26 DIAGNOSIS — J309 Allergic rhinitis, unspecified: Secondary | ICD-10-CM | POA: Diagnosis not present

## 2021-01-03 ENCOUNTER — Ambulatory Visit (INDEPENDENT_AMBULATORY_CARE_PROVIDER_SITE_OTHER): Payer: Medicare Other | Admitting: *Deleted

## 2021-01-03 DIAGNOSIS — J309 Allergic rhinitis, unspecified: Secondary | ICD-10-CM | POA: Diagnosis not present

## 2021-01-04 ENCOUNTER — Other Ambulatory Visit: Payer: Self-pay | Admitting: Allergy and Immunology

## 2021-01-04 DIAGNOSIS — J302 Other seasonal allergic rhinitis: Secondary | ICD-10-CM | POA: Diagnosis not present

## 2021-01-04 NOTE — Progress Notes (Signed)
VIAL MADE. EXP 01-04-22

## 2021-01-10 ENCOUNTER — Ambulatory Visit (INDEPENDENT_AMBULATORY_CARE_PROVIDER_SITE_OTHER): Payer: Medicare Other

## 2021-01-10 DIAGNOSIS — J309 Allergic rhinitis, unspecified: Secondary | ICD-10-CM

## 2021-01-26 ENCOUNTER — Other Ambulatory Visit: Payer: Self-pay | Admitting: Allergy and Immunology

## 2021-04-04 ENCOUNTER — Other Ambulatory Visit: Payer: Self-pay | Admitting: Allergy and Immunology

## 2021-05-28 ENCOUNTER — Ambulatory Visit (INDEPENDENT_AMBULATORY_CARE_PROVIDER_SITE_OTHER): Payer: Medicare Other | Admitting: Pulmonary Disease

## 2021-05-28 ENCOUNTER — Encounter: Payer: Self-pay | Admitting: Pulmonary Disease

## 2021-05-28 VITALS — BP 124/62 | HR 84 | Temp 98.5°F | Ht 70.0 in | Wt 197.8 lb

## 2021-05-28 DIAGNOSIS — R052 Subacute cough: Secondary | ICD-10-CM | POA: Diagnosis not present

## 2021-05-28 DIAGNOSIS — J4530 Mild persistent asthma, uncomplicated: Secondary | ICD-10-CM | POA: Diagnosis not present

## 2021-05-28 MED ORDER — FLUTICASONE PROPIONATE 50 MCG/ACT NA SUSP: NASAL | 11 refills | Status: DC

## 2021-05-28 MED ORDER — ALBUTEROL SULFATE HFA 108 (90 BASE) MCG/ACT IN AERS: INHALATION_SPRAY | RESPIRATORY_TRACT | 1 refills | Status: DC

## 2021-05-28 NOTE — Patient Instructions (Signed)
Nice to see you again ? ?I refilled the albuterol inhaler, use this as needed for chest tightness, shortness of breath, or cough ? ?Resume Flonase, I sent a new prescription ? ?Using 2 sprays each nostril twice a day for 1 week then decrease to 1 spray each nostril twice a day thereafter ? ?Lets see if the cough improves after resuming the Flonase.  If not please let me know ? ?Return to clinic in 3 months or sooner as needed with Dr. Silas Flood ?

## 2021-05-29 NOTE — Progress Notes (Signed)
? ?'@Patient'  ID: Bobby Wheeler, male    DOB: 10/25/1950, 71 y.o.   MRN: [id number] ? ?Chief Complaint  ?Patient presents with  ? Follow-up  ?  Pt was last seen last year. He states he has a cough. Pt states overall he is doing well. Cough for about a week now, and some irritation in the throat.   ? ? ?Referring provider: ?Bonnita Nasuti, MD ? ?HPI:  ? ?71 year old man whom we are seeing in follow up after COVID-19 pneumonia 07/2019 and for asthma.  Most recent note from allergist and immunologist reviewed. Chief complaint is cough. ? ?Overall doing well.  Breathing at baseline.  Cough previously resolved on inhaler therapy and Fasenra for asthma as well as montelukast and antihistamine.  In addition, instructed to do nasal sprays including Flonase.  Over the last 1 week to 10 days cough has reemerged.  Feels like mucus in his throat.  Points to his throat with location of discomfort.  Worse when lying supine or at night.  Initially denies any nasal congestion or runny nose but then later does endorse some sensation of postnasal drip at night.  Upon further questioning he had run out of Flonase and has been not using it for the last 2 weeks or so.  He also request refill of albuterol.  He reports rare usage, none in the last 2 or 3 months. ? ?HPI initial visit: ?Overall, patient feels okay.  He feels like his asthma control is getting better over time.  He is on maximal inhaled therapy as well as oral montelukast and several medicines for rhino nasal allergy symptoms.  He still has a persistent cough.  Sometimes dry, sometimes productive.  Denies any postnasal drip.  Denies significant reflux.  No clear alleviating or exacerbating factors.  No environment works better or worse.  No timing during the day worse better or worse. ? ?Patient was visiting being [place] in July 2021.  He developed COVID-19 infection.  He was hospitalized for several days per his report.  He did require BiPAP at some point time.  Never  intubated.  He was discharged without oxygen.  Is taking a many months to come to get the strength back.  He feels like he is close now but not quite.  He brought physical films with him from the hospital in [place].  These were personally reviewed including chest x-rays x2 as well as CT scan.  Both demonstrate bilateral interstitial and alveolar filling infiltrates better demonstrated on CT scan with predilection for peribronchovascular areas which are quite dense on my interpretation. ? ?PMH: Asthma, seasonal allergies, severe COVID-19 pneumonia 07/2019 on BiPAP never intubated ?Surgical history: Cataract, hernia surgery ?Family history: Mother with asthma, stroke, sister with diabetes ?Social history: Former smoker, quit around 1990, plans to retire to [place] at some point, retired from previous job, lives in [place] ?Questionaires / Pulmonary Flowsheets:  ? ?ACT:  ?Asthma Control Test ACT Total Score  ?08/08/2020 ? 3:21 PM 19  ?04/12/2020 ? 3:52 PM 17  ?05/12/2017 ? 3:00 PM 13  ? ? ?MMRC: ?   ? View : No data to display.  ?  ?  ?  ? ? ?Epworth:  ?   ? View : No data to display.  ?  ?  ?  ? ? ?Tests:  ? ?FENO:  ?No results found for: NITRICOXIDE ? ?PFT: ?   ? View : No data to display.  ?  ?  ?  ? ? ?WALK:  ?   ?  View : No data to display.  ?  ?  ?  ? ? ?Imaging: ?Personally reviewed films he brought from [place], please see above ?No results found. ? ? ?Lab Results: ?Personally reviewed, eosinophils persistently elevated ?CBC ?   ?Component Value Date/Time  ? WBC 8.5 12/13/2019 1653  ? RBC 5.89 (H) 12/13/2019 1653  ? HGB 16.3 12/13/2019 1653  ? HCT 49.3 12/13/2019 1653  ? MCV 84 12/13/2019 1653  ? MCH 27.7 12/13/2019 1653  ? MCHC 33.1 12/13/2019 1653  ? RDW 14.1 12/13/2019 1653  ? LYMPHSABS 1.9 12/13/2019 1653  ? EOSABS 0.6 (H) 12/13/2019 1653  ? BASOSABS 0.1 12/13/2019 1653  ? ? ?BMET ?   ?Component Value Date/Time  ? NA 138 12/03/2007 0845  ? K 4.8 12/03/2007 0845  ? CL 103 12/03/2007 0845  ? CO2 27  12/03/2007 0845  ? GLUCOSE 112 (H) 12/03/2007 0845  ? BUN 21 12/03/2007 0845  ? CREATININE 1.20 12/03/2007 0845  ? CALCIUM 8.9 12/03/2007 0845  ? GFRNONAA >60 12/03/2007 0845  ? GFRAA  12/03/2007 0845  ?  >60        ?The eGFR has been calculated ?using the MDRD equation. ?This calculation has not been ?validated in all clinical  ? ? ?BNP ?No results found for: BNP ? ?ProBNP ?No results found for: PROBNP ? ?Specialty Problems   ? ?  ? Pulmonary Problems  ? Asthma exacerbation  ? ? ?No Known Allergies ? ?Immunization History  ?Administered Date(s) Administered  ? Influenza Split 12/11/2013, 09/09/2014, 01/12/2016  ? Influenza, High Dose Seasonal PF 11/26/2017  ? Influenza, Quadrivalent, Recombinant, Inj, Pf 12/15/2019  ? Influenza,inj,Quad PF,6+ Mos 01/12/2016  ? Influenza,inj,Quad PF,6-35 Mos 10/07/2016  ? Influenza-Unspecified 12/11/2013, 09/09/2014  ? Pneumococcal Conjugate-13 11/26/2017  ? Pneumococcal Polysaccharide-23 07/13/2013  ? ? ?Past Medical History:  ?Diagnosis Date  ? Asthma   ? Diabetes ([place])   ? Hypertension   ? LPRD (laryngopharyngeal reflux disease)   ? ? ?Tobacco History: ?Social History  ? ?Tobacco Use  ?Smoking Status Former  ? Types: Cigarettes  ? Start date: 83  ? Quit date: 01/14/1973  ? Years since quitting: 48.4  ?Smokeless Tobacco Never  ? ?Counseling given: Not Answered ? ? ?Continue to not smoke ? ?Outpatient Encounter Medications as of 05/28/2021  ?Medication Sig  ? amLODipine-benazepril (LOTREL) 10-20 MG capsule Take 1 capsule by mouth daily.   ? aspirin EC 81 MG tablet Take 81 mg by mouth daily.   ? atorvastatin (LIPITOR) 40 MG tablet Take 40 mg by mouth daily.  ? Benralizumab (FASENRA PEN) 30 MG/ML SOAJ Inject 1 mL (30 mg total) into the skin every 8 (eight) weeks.  ? BREZTRI AEROSPHERE 160-9-4.8 MCG/ACT AERO INHALE 2 PUFFS BY MOUTH INTO THE LUNGS IN THE MORNING AND BEDTIME, USE WITH SPACER. RINSE, GARGLE AND SPIT AFTER USE  ? doxazosin (CARDURA) 2 MG tablet Take 2 mg by mouth daily.  ?  EQ ALLERGY RELIEF, CETIRIZINE, 10 MG tablet TAKE 1 TABLET BY MOUTH ONCE DAILY AS NEEDED  ? esomeprazole (NEXIUM) 40 MG capsule TAKE 1 CAPSULE BY MOUTH TWICE DAILY AS DIRECTED  ? famotidine (PEPCID) 40 MG tablet TAKE 1 TABLET BY MOUTH ONCE DAILY AT BEDTIME  ? hydrochlorothiazide (HYDRODIURIL) 25 MG tablet Take 25 mg by mouth daily.   ? ibuprofen (ADVIL,MOTRIN) 800 MG tablet Take 800 mg by mouth every 8 (eight) hours as needed. for pain  ? metFORMIN (GLUCOPHAGE) 1000 MG tablet 1,000 mg. Take two tablets once  daily as directed.  ? mometasone (ELOCON) 0.1 % ointment Apply to hands 1 time per day  ? montelukast (SINGULAIR) 10 MG tablet Take 1 tablet by mouth once daily  ? Multiple Vitamins-Minerals (MULTIVITAMIN ADULT PO) Take by mouth daily.  ? Olopatadine HCl (PATADAY) 0.2 % SOLN Use one drop in each eye once daily if needed  ? Tadalafil 2.5 MG TABS Take by mouth.  ? TRULICITY 1.5 RX/2.1LJ SOPN SMARTSIG:0.5 Milliliter(s) SUB-Q Once a Week  ? [DISCONTINUED] albuterol (PROAIR HFA) 108 (90 Base) MCG/ACT inhaler Can use two puffs every four to six hours as needed for cough or wheeze.  ? [DISCONTINUED] fluticasone (FLONASE) 50 MCG/ACT nasal spray Use one to two sprays in each nostril once daily as directed.  ? albuterol (PROAIR HFA) 108 (90 Base) MCG/ACT inhaler Can use two puffs every four to six hours as needed for cough or wheeze.  ? fluticasone (FLONASE) 50 MCG/ACT nasal spray Use one to two sprays in each nostril once daily as directed.  ? [DISCONTINUED] JARDIANCE 10 MG TABS tablet Take 10 mg by mouth daily.  ? ?No facility-administered encounter medications on file as of 05/28/2021.  ? ? ? ?Review of Systems ? ?Review of Systems  ?N/A ?Physical Exam ? ?BP 124/62 (BP Location: Left Arm, Patient Position: Sitting, Cuff Size: Normal)   Pulse 84   Temp 98.5 ?F (36.9 ?C) (Oral)   Ht '5\' 10"'  (1.778 m)   Wt 197 lb 12.8 oz (89.7 kg)   SpO2 99%   BMI 28.38 kg/m?  ? ?Wt Readings from Last 5 Encounters:  ?05/28/21 197 lb  12.8 oz (89.7 kg)  ?08/08/20 200 lb (90.7 kg)  ?04/12/20 195 lb (88.5 kg)  ?02/25/20 201 lb (91.2 kg)  ?02/03/20 204 lb 12.8 oz (92.9 kg)  ? ? ?BMI Readings from Last 5 Encounters:  ?05/28/21 28.38 kg/m?  ?08/08/20 28.70 kg/

## 2021-07-02 ENCOUNTER — Other Ambulatory Visit: Payer: Self-pay | Admitting: Allergy and Immunology

## 2021-07-17 ENCOUNTER — Other Ambulatory Visit: Payer: Self-pay | Admitting: Allergy and Immunology

## 2021-08-01 ENCOUNTER — Other Ambulatory Visit: Payer: Self-pay | Admitting: Allergy and Immunology

## 2021-08-15 ENCOUNTER — Ambulatory Visit (INDEPENDENT_AMBULATORY_CARE_PROVIDER_SITE_OTHER): Payer: Medicare Other | Admitting: Allergy and Immunology

## 2021-08-15 VITALS — BP 114/62 | HR 75 | Temp 98.2°F | Resp 18 | Ht 67.5 in | Wt 196.0 lb

## 2021-08-15 DIAGNOSIS — L309 Dermatitis, unspecified: Secondary | ICD-10-CM

## 2021-08-15 DIAGNOSIS — J4551 Severe persistent asthma with (acute) exacerbation: Secondary | ICD-10-CM

## 2021-08-15 DIAGNOSIS — K219 Gastro-esophageal reflux disease without esophagitis: Secondary | ICD-10-CM

## 2021-08-15 DIAGNOSIS — J3089 Other allergic rhinitis: Secondary | ICD-10-CM

## 2021-08-15 DIAGNOSIS — J4550 Severe persistent asthma, uncomplicated: Secondary | ICD-10-CM | POA: Diagnosis not present

## 2021-08-15 MED ORDER — OLOPATADINE HCL 0.2 % OP SOLN: OPHTHALMIC | 5 refills | Status: AC

## 2021-08-15 MED ORDER — CETIRIZINE HCL 10 MG PO TABS: 10.0000 mg | ORAL_TABLET | Freq: Every day | ORAL | 5 refills | Status: AC | PRN

## 2021-08-15 MED ORDER — MONTELUKAST SODIUM 10 MG PO TABS: 10.0000 mg | ORAL_TABLET | Freq: Every day | ORAL | 1 refills | Status: DC

## 2021-08-15 MED ORDER — FAMOTIDINE 40 MG PO TABS: 40.0000 mg | ORAL_TABLET | Freq: Every day | ORAL | 1 refills | Status: DC

## 2021-08-15 MED ORDER — ESOMEPRAZOLE MAGNESIUM 40 MG PO CPDR: DELAYED_RELEASE_CAPSULE | ORAL | 1 refills | Status: DC

## 2021-08-15 MED ORDER — MOMETASONE FUROATE 0.1 % EX OINT: TOPICAL_OINTMENT | CUTANEOUS | 3 refills | Status: AC

## 2021-08-15 NOTE — Progress Notes (Unsigned)
[facility] [place] [facility] [place]   Follow-up Note  Referring Provider: Bonnita Nasuti, MD Primary Provider: Bonnita Nasuti, MD Date of Office Visit: 08/15/2021  Subjective:   Bobby Wheeler (DOB: May 09, 1950) is a 71 y.o. male who returns to the [facility] on 08/15/2021 in re-evaluation of the following:  HPI: Bobby Wheeler presents to this clinic in evaluation of severe asthma, allergic rhinoconjunctivitis, reflux, and hand eczema.  His last visit to this clinic was 16 November 2020.  He has actually done very well while utilizing benralizumab and several other anti-inflammatory agents for his airway on a consistent basis and has not required a systemic steroid or an antibiotic for any type of airway issue and rarely uses a short acting bronchodilator and he can exert himself without any problem.  However, approximately 10 days ago he had acute onset of fever at 101 with chills and aches and runny nose and coughing.  All of his nasal symptoms have resolved and he does not have any associated systemic or constitutional symptoms and he does not have any fever.  He still has a cough.  He has no anosmia or decreased ability to taste or headaches or chest pain or ugly sputum production or ugly nasal discharge.  His reflux is under very good control on his current plan.  He has good control of his hand eczema.  Allergies as of 08/15/2021   No Known Allergies      Medication List    albuterol 108 (90 Base) MCG/ACT inhaler Commonly known as: ProAir HFA Can use two puffs every four to six hours as needed for cough or wheeze.   amLODipine-benazepril 10-20 MG capsule Commonly known as: LOTREL Take 1 capsule by mouth daily.   aspirin EC 81 MG tablet Take 81 mg by mouth daily.   atorvastatin 40 MG tablet Commonly known as: LIPITOR Take 40 mg by mouth daily.   Breztri Aerosphere 160-9-4.8 MCG/ACT Aero Generic drug: Budeson-Glycopyrrol-Formoterol INHALE  2 PUFFS BY MOUTH INTO THE LUNGS IN THE MORNING AND BEDTIME, USE WITH SPACER. RINSE, GARGLE AND SPIT AFTER USE   doxazosin 2 MG tablet Commonly known as: CARDURA Take 2 mg by mouth daily.   EQ Allergy Relief (Cetirizine) 10 MG tablet Generic drug: cetirizine TAKE 1 TABLET BY MOUTH ONCE DAILY AS NEEDED   esomeprazole 40 MG capsule Commonly known as: NEXIUM TAKE 1 CAPSULE BY MOUTH TWICE DAILY AS DIRECTED   famotidine 40 MG tablet Commonly known as: PEPCID TAKE 1 TABLET BY MOUTH ONCE DAILY AT BEDTIME   Fasenra Pen 30 MG/ML [place] Generic drug: Benralizumab Inject 1 mL (30 mg total) into the skin every 8 (eight) weeks.   fluticasone 50 MCG/ACT nasal spray Commonly known as: FLONASE Use one to two sprays in each nostril once daily as directed.   hydrochlorothiazide 25 MG tablet Commonly known as: HYDRODIURIL Take 25 mg by mouth daily.   ibuprofen 800 MG tablet Commonly known as: ADVIL Take 800 mg by mouth every 8 (eight) hours as needed. for pain   metFORMIN 1000 MG tablet Commonly known as: GLUCOPHAGE 1,000 mg. Take two tablets once daily as directed.   mometasone 0.1 % ointment Commonly known as: ELOCON Apply to hands 1 time per day   montelukast 10 MG tablet Commonly known as: SINGULAIR Take 1 tablet by mouth once daily   MULTIVITAMIN ADULT PO Take by mouth daily.   Olopatadine HCl 0.2 % Soln Commonly known as: Pataday Use  one drop in each eye once daily if needed   Tadalafil 2.5 MG Tabs Take by mouth.   Trulicity 1.5 TM/1.9QQ Sopn Generic drug: Dulaglutide SMARTSIG:0.5 Milliliter(s) SUB-Q Once a Week    Past Medical History:  Diagnosis Date   Asthma    Diabetes ([place])    Hypertension    LPRD (laryngopharyngeal reflux disease)     Past Surgical History:  Procedure Laterality Date   CATARACT EXTRACTION  2020   HERNIA REPAIR      Review of systems negative except as noted in HPI / PMHx or noted below:  Review of Systems  Constitutional: Negative.    HENT: Negative.    Eyes: Negative.   Respiratory: Negative.    Cardiovascular: Negative.   Gastrointestinal: Negative.   Genitourinary: Negative.   Musculoskeletal: Negative.   Skin: Negative.   Neurological: Negative.   Endo/Heme/Allergies: Negative.   Psychiatric/Behavioral: Negative.       Objective:   Vitals:   08/15/21 1639  BP: 114/62  Pulse: 75  Resp: 18  Temp: 98.2 F (36.8 C)  SpO2: 97%   Height: 5' 7.5" (171.5 cm)  Weight: 196 lb (88.9 kg)   Physical Exam Constitutional:      Appearance: He is not diaphoretic.  HENT:     Head: Normocephalic.     Right Ear: Tympanic membrane, ear canal and external ear normal.     Left Ear: Tympanic membrane, ear canal and external ear normal.     Nose: Nose normal. No mucosal edema or rhinorrhea.     Mouth/Throat:     Pharynx: Uvula midline. No oropharyngeal exudate.  Eyes:     Conjunctiva/sclera: Conjunctivae normal.  Neck:     Thyroid: No thyromegaly.     Trachea: Trachea normal. No tracheal tenderness or tracheal deviation.  Cardiovascular:     Rate and Rhythm: Normal rate and regular rhythm.     Heart sounds: Normal heart sounds, S1 normal and S2 normal. No murmur heard. Pulmonary:     Effort: No respiratory distress.     Breath sounds: Normal breath sounds. No stridor. No wheezing or rales.  Lymphadenopathy:     Head:     Right side of head: No tonsillar adenopathy.     Left side of head: No tonsillar adenopathy.     Cervical: No cervical adenopathy.  Skin:    Findings: No erythema or rash.     Nails: There is no clubbing.  Neurological:     Mental Status: He is alert.     Diagnostics:    Spirometry was performed and demonstrated an FEV1 of 2.05 at 70 % of predicted.  Assessment and Plan:   1. Asthma, not well controlled, severe persistent, with acute exacerbation   2. Other allergic rhinitis   3. LPRD (laryngopharyngeal reflux disease)   4. Hand eczema     1.  Continue benralizumab injections  every 8 weeks   2.  Continue Breztri - 2 inhalations 1-2 times per day with spacer   3.  Continue montelukast 10 mg one tablet one time per day  4. Continue Fluticasone 1-2 puffs each nostril one time per day  5. Continue generic Nexium '40mg'$  1-2 times per day  6. Continue Famotidine 40 mg - 1 tablet at bedtime  7. Can use the following if needed:    A.  Proventil HFA or similar  B.  Cetirizine 10 mg 1 tablet 1 time per day  C.  Pataday 1 drop each eye 1  time per day  D.  Mometasone 0.1% ointment applied to hands 1 time per day  8. For this recent event:    A. Prednisone 10 mg - 1 tablet 1 time per day for 10 days only  9.  Obtain fall flu vaccine and RSV vaccine  10. Return to clinic in 6 months or earlier if problem  Shalin most likely had a viral respiratory tract infection that invaded his body 10 days ago with fever and chills and myalgias along with cough.  I think he has probably killed that virus and now is just left with some of the inflammation from that event and we will give him a very low-dose of systemic steroids as noted above to supplement the anti-inflammatory therapy for his airway which does include the consistent use of benralizumab injections every 8 weeks.  As well, his reflux is under very good control on his current plan.  Assuming he does well I will see him back in this clinic in 6 months or earlier if there is a problem.  Allena Katz, MD Allergy / Immunology [place]

## 2021-08-15 NOTE — Patient Instructions (Signed)
  1.  Continue benralizumab injections every 8 weeks   2.  Continue Breztri - 2 inhalations 1-2 times per day with spacer   3.  Continue montelukast 10 mg one tablet one time per day  4. Continue Fluticasone 1-2 puffs each nostril one time per day  5. Continue generic Nexium '40mg'$  1-2 times per day  6. Continue Famotidine 40 mg - 1 tablet at bedtime  7. Can use the following if needed:    A.  Proventil HFA or similar  B.  Cetirizine 10 mg 1 tablet 1 time per day  C.  Pataday 1 drop each eye 1 time per day  D.  Mometasone 0.1% ointment applied to hands 1 time per day  8. For this recent event:    A. Prednisone 10 mg - 1 tablet 1 time per day for 10 days only  9.  Obtain fall flu vaccine and RSV vaccine  10. Return to clinic in 6 months or earlier if problem

## 2021-08-16 ENCOUNTER — Encounter: Payer: Self-pay | Admitting: Allergy and Immunology

## 2021-09-05 ENCOUNTER — Ambulatory Visit: Payer: Medicare Other | Admitting: Pulmonary Disease

## 2021-11-01 IMAGING — DX DG CHEST 2V
2 series · 2 of 2 positions shown · non-contrast
Comparison: 02/25/2020

CLINICAL DATA: 69-year-old male with COVID

EXAM:
CHEST - 2 VIEW

[chest pa]
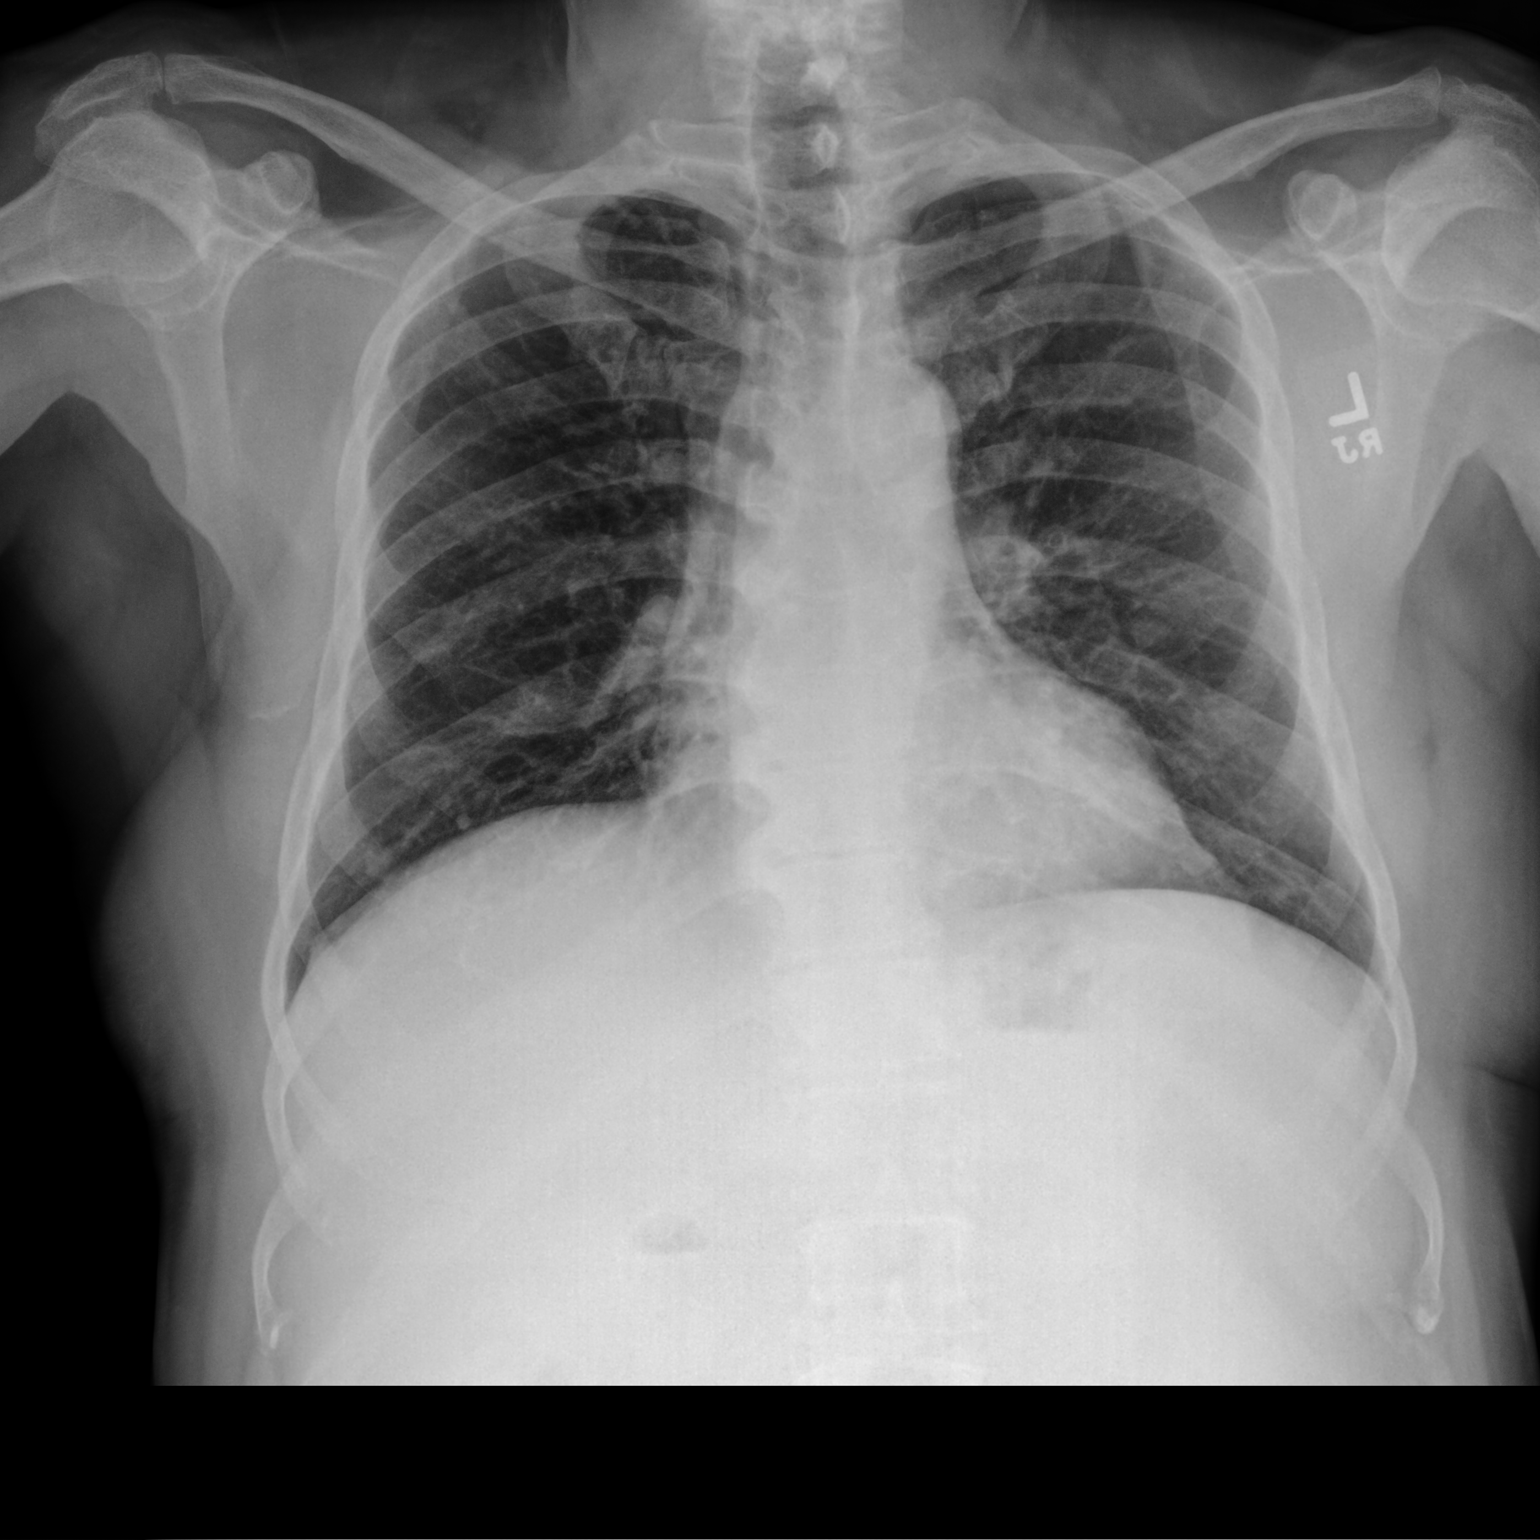

[chest lat]
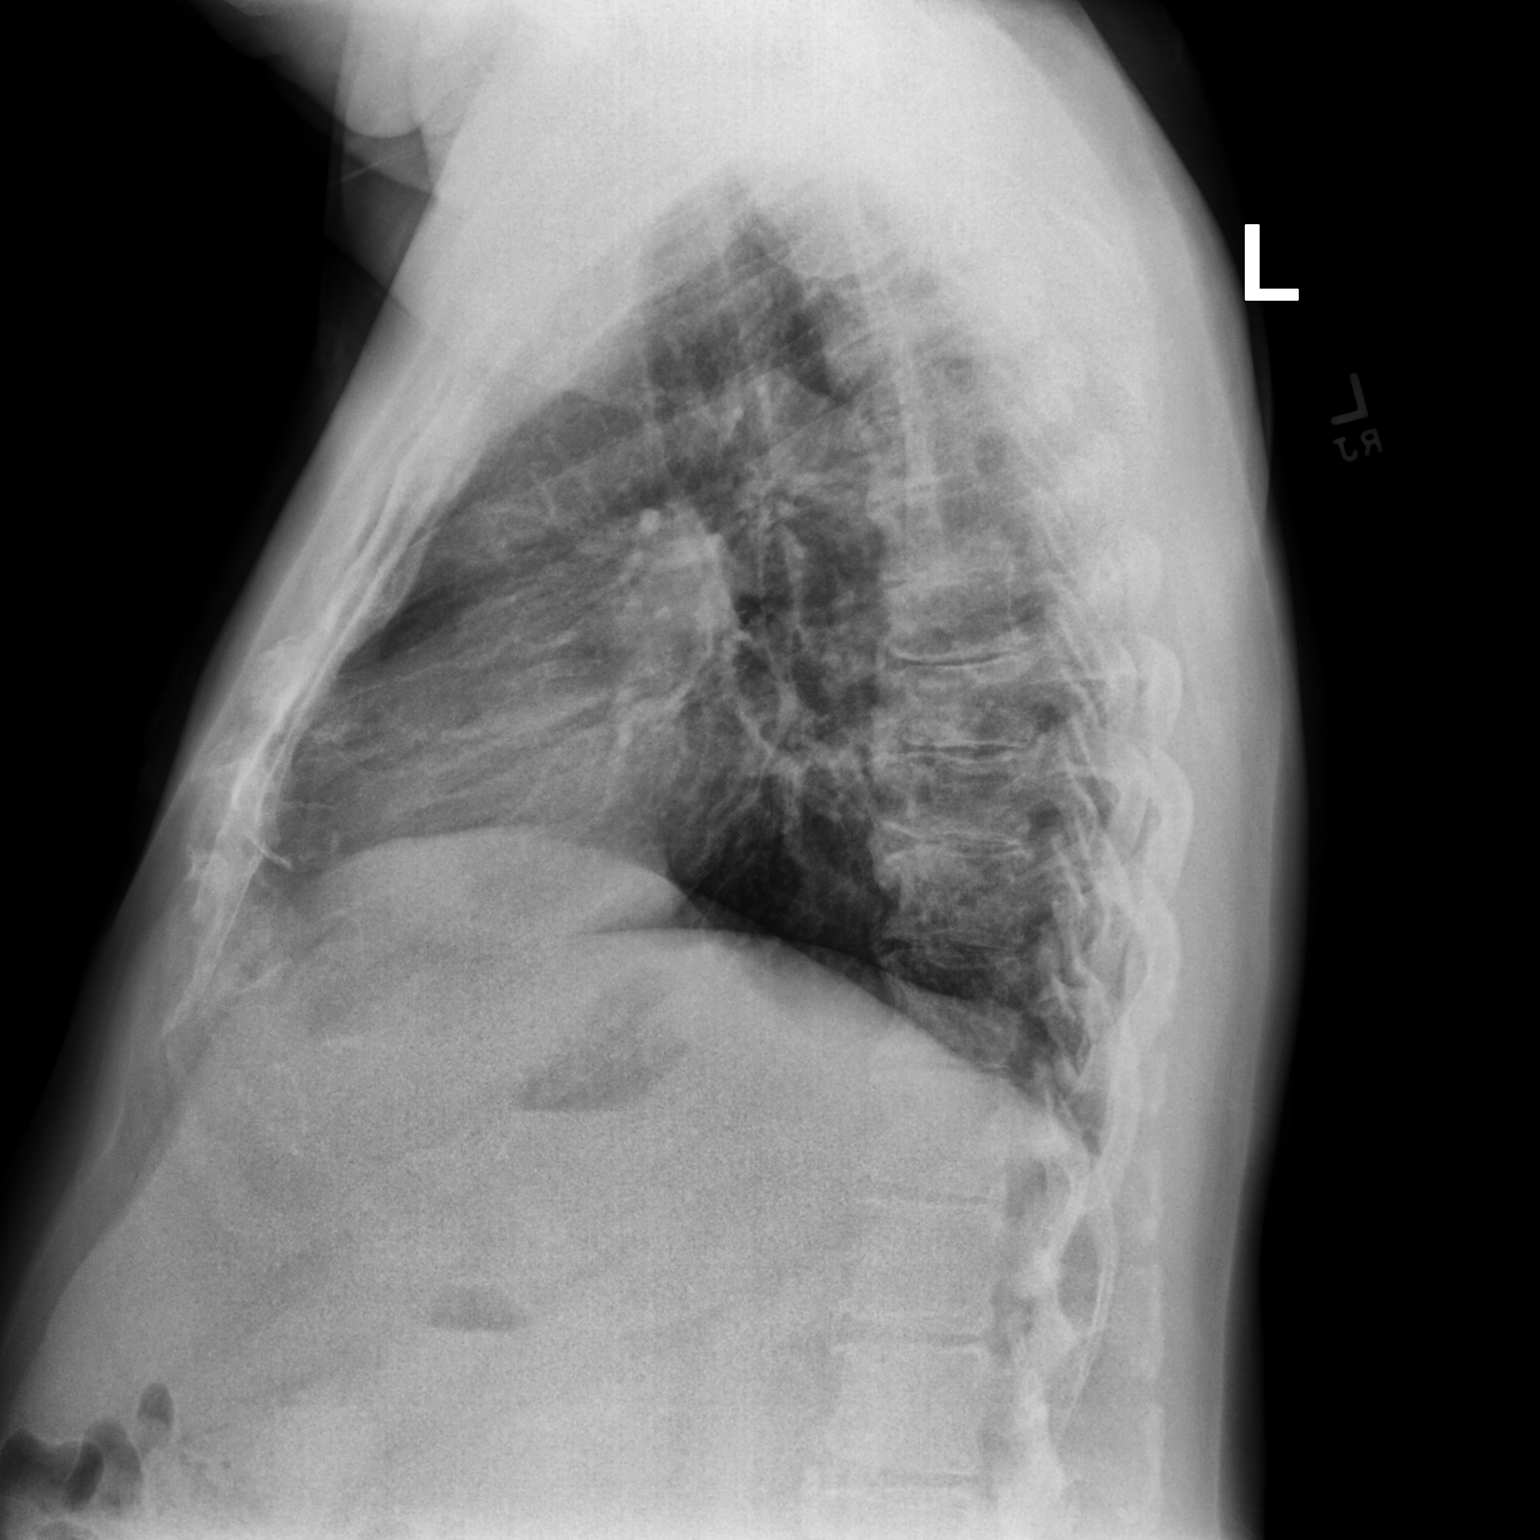

[2 of 2 positions shown; findings below may reference images not displayed]

FINDINGS: Cardiomediastinal silhouette unchanged in size and contour. No
evidence of central vascular congestion. No interlobular septal
thickening.

Low lung volumes. No change in coarsened interstitial markings. No
new confluent airspace disease. No pleural effusion or pneumothorax.

No acute displaced fracture. Degenerative changes of the spine.
IMPRESSION: Similar appearance of the chest, with no evidence of acute
cardiopulmonary disease

## 2021-12-25 ENCOUNTER — Other Ambulatory Visit: Payer: Self-pay | Admitting: Allergy and Immunology

## 2022-02-05 ENCOUNTER — Telehealth: Payer: Self-pay | Admitting: Allergy and Immunology

## 2022-02-05 MED ORDER — FAMOTIDINE 40 MG PO TABS: 40.0000 mg | ORAL_TABLET | Freq: Every day | ORAL | 1 refills | Status: DC

## 2022-02-05 NOTE — Telephone Encounter (Signed)
Patient is needing a refill on Famotidine sent to [facility] in [place].

## 2022-02-05 NOTE — Telephone Encounter (Signed)
Refill sent to requested pharmacy.

## 2022-02-28 ENCOUNTER — Other Ambulatory Visit: Payer: Self-pay | Admitting: Allergy and Immunology

## 2022-03-11 ENCOUNTER — Encounter: Payer: Self-pay | Admitting: Allergy and Immunology

## 2022-03-11 ENCOUNTER — Ambulatory Visit (INDEPENDENT_AMBULATORY_CARE_PROVIDER_SITE_OTHER): Payer: 59 | Admitting: Allergy and Immunology

## 2022-03-11 VITALS — BP 138/72 | HR 84 | Resp 18 | Ht 69.0 in | Wt 203.0 lb

## 2022-03-11 DIAGNOSIS — L309 Dermatitis, unspecified: Secondary | ICD-10-CM

## 2022-03-11 DIAGNOSIS — J4550 Severe persistent asthma, uncomplicated: Secondary | ICD-10-CM | POA: Diagnosis not present

## 2022-03-11 DIAGNOSIS — K219 Gastro-esophageal reflux disease without esophagitis: Secondary | ICD-10-CM

## 2022-03-11 DIAGNOSIS — J3089 Other allergic rhinitis: Secondary | ICD-10-CM | POA: Diagnosis not present

## 2022-03-11 MED ORDER — ALBUTEROL SULFATE HFA 108 (90 BASE) MCG/ACT IN AERS: INHALATION_SPRAY | RESPIRATORY_TRACT | 1 refills | Status: AC

## 2022-03-11 MED ORDER — FLUTICASONE PROPIONATE 50 MCG/ACT NA SUSP: NASAL | 5 refills | Status: DC

## 2022-03-11 MED ORDER — BREZTRI AEROSPHERE 160-9-4.8 MCG/ACT IN AERO: INHALATION_SPRAY | RESPIRATORY_TRACT | 5 refills | Status: DC

## 2022-03-11 NOTE — Progress Notes (Unsigned)
[place] [facility] [place]   Follow-up Note  Referring Provider: Bonnita Nasuti, MD Primary Provider: Bonnita Nasuti, MD Date of Office Visit: 03/11/2022  Subjective:   Bobby Wheeler (DOB: 1950-02-17) is a 72 y.o. male who returns to the [facility] [place] on 03/11/2022 in re-evaluation of the following:  HPI: Bobby Wheeler presents to the clinic in evaluation of severe asthma, allergic rhinoconjunctivitis, reflux, and hand eczema.  I last saw him in this clinic 15 August 2021.  He has noticed that over the course of the past several weeks he is starting to develop his springtime pollen problem.  He has had a lot more itchiness in his throat and his nose and his eyes he has developed a little bit of cough and some wheeze.  Unfortunately, this occurred in the context of not using his benralizumab injections as there was some logistical issue about getting his benralizumab injections.  Prior to this early spring season he has really done relatively well and has not required a systemic steroid or an antibiotic for any type of airway issue and believes that with the use of anti-inflammatory medicines for both his upper and lower airway in conjunction with benralizumab injections he had very good control of his airway disease.  And his reflux was under good control with Nexium and famotidine.  And his hand eczema was under good control with topical mometasone.  Allergies as of 03/11/2022   No Known Allergies      Medication List    albuterol 108 (90 Base) MCG/ACT inhaler Commonly known as: ProAir HFA Can use two puffs every four to six hours as needed for cough or wheeze.   amLODipine-benazepril 10-20 MG capsule Commonly known as: LOTREL Take 1 capsule by mouth daily.   aspirin EC 81 MG tablet Take 81 mg by mouth daily.   atorvastatin 40 MG tablet Commonly known as: LIPITOR Take 40 mg by mouth daily.   Breztri Aerosphere 160-9-4.8 MCG/ACT  Aero Generic drug: Budeson-Glycopyrrol-Formoterol INHALE 2 PUFFS BY MOUTH INTO THE LUNGS IN THE MORNING AND BEDTIME, USE WITH SPACER. RINSE, GARGLE AND SPIT AFTER USE   cetirizine 10 MG tablet Commonly known as: ZYRTEC Take 1 tablet (10 mg total) by mouth daily as needed for allergies.   esomeprazole 40 MG capsule Commonly known as: NEXIUM Take 1-2 tabs per day   famotidine 40 MG tablet Commonly known as: PEPCID Take 1 tablet (40 mg total) by mouth at bedtime.   Fasenra Pen 30 MG/ML Soaj Generic drug: Benralizumab INJECT '30MG'$  SUBCUTANEOUSLY EVERY 8 WEEKS   FISH OIL PO Take by mouth daily.   fluticasone 50 MCG/ACT nasal spray Commonly known as: FLONASE Use one to two sprays in each nostril once daily as directed.   hydrochlorothiazide 25 MG tablet Commonly known as: HYDRODIURIL Take 25 mg by mouth daily.   ibuprofen 800 MG tablet Commonly known as: ADVIL Take 800 mg by mouth every 8 (eight) hours as needed. for pain   meloxicam 15 MG tablet Commonly known as: MOBIC Take 15 mg by mouth daily.   metFORMIN 1000 MG tablet Commonly known as: GLUCOPHAGE 1,000 mg. Take two tablets once daily as directed.   mometasone 0.1 % ointment Commonly known as: ELOCON Apply to affected areas 1 time per day   montelukast 10 MG tablet Commonly known as: SINGULAIR Take 1 tablet by mouth once daily   MULTIVITAMIN ADULT PO Take by mouth daily.   Olopatadine HCl 0.2 %  Soln Commonly known as: Pataday Use one drop in each eye once daily if needed   pioglitazone 30 MG tablet Commonly known as: ACTOS Take 30 mg by mouth daily.   Tadalafil 2.5 MG Tabs Take by mouth.   tamsulosin 0.4 MG Caps capsule Commonly known as: FLOMAX   tiZANidine 4 MG tablet Commonly known as: ZANAFLEX Take 4 mg by mouth daily as needed.   Trulicity 1.5 0000000 Sopn Generic drug: Dulaglutide SMARTSIG:0.5 Milliliter(s) SUB-Q Once a Week   Vitamin D (Ergocalciferol) 1.25 MG (50000 UNIT) Caps  capsule Commonly known as: DRISDOL Take 50,000 Units by mouth once a week.   zolpidem 10 MG tablet Commonly known as: AMBIEN    Past Medical History:  Diagnosis Date   Asthma    Diabetes ([place])    Hypertension    LPRD (laryngopharyngeal reflux disease)     Past Surgical History:  Procedure Laterality Date   CATARACT EXTRACTION  2020   HERNIA REPAIR      Review of systems negative except as noted in HPI / PMHx or noted below:  Review of Systems  Constitutional: Negative.   HENT: Negative.    Eyes: Negative.   Respiratory: Negative.    Cardiovascular: Negative.   Gastrointestinal: Negative.   Genitourinary: Negative.   Musculoskeletal: Negative.   Skin: Negative.   Neurological: Negative.   Endo/Heme/Allergies: Negative.   Psychiatric/Behavioral: Negative.       Objective:   Vitals:   03/11/22 1517  BP: 138/72  Pulse: 84  Resp: 18  SpO2: 95%   Height: '5\' 9"'$  (175.3 cm)  Weight: 203 lb (92.1 kg)   Physical Exam Constitutional:      Appearance: He is not diaphoretic.  HENT:     Head: Normocephalic.     Right Ear: Tympanic membrane, ear canal and external ear normal.     Left Ear: Tympanic membrane, ear canal and external ear normal.     Nose: Nose normal. No mucosal edema or rhinorrhea.     Mouth/Throat:     Pharynx: Uvula midline. No oropharyngeal exudate.  Eyes:     Conjunctiva/sclera: Conjunctivae normal.  Neck:     Thyroid: No thyromegaly.     Trachea: Trachea normal. No tracheal tenderness or tracheal deviation.  Cardiovascular:     Rate and Rhythm: Normal rate and regular rhythm.     Heart sounds: Normal heart sounds, S1 normal and S2 normal. No murmur heard. Pulmonary:     Effort: No respiratory distress.     Breath sounds: Normal breath sounds. No stridor. No wheezing or rales.  Lymphadenopathy:     Head:     Right side of head: No tonsillar adenopathy.     Left side of head: No tonsillar adenopathy.     Cervical: No cervical adenopathy.   Skin:    Findings: No erythema or rash.     Nails: There is no clubbing.  Neurological:     Mental Status: He is alert.     Diagnostics:    Spirometry was performed and demonstrated an FEV1 of 2.05 at 70 % of predicted.  Assessment and Plan:   1. Not well controlled severe persistent asthma   2. Other allergic rhinitis   3. LPRD (laryngopharyngeal reflux disease)   4. Hand eczema    1.  RESTART benralizumab injections - sample for home use today  2.  Continue Breztri - 2 inhalations 1-2 times per day with spacer   3.  Continue montelukast 10 mg one  tablet one time per day  4. Continue Fluticasone 1-2 puffs each nostril one time per day  5. Continue generic Nexium '40mg'$  1-2 times per day  6. Continue Famotidine 40 mg - 1 tablet at bedtime  7. Can use the following if needed:    A.  Proventil HFA or similar  B.  Cetirizine 10 mg 1 tablet 1 time per day  C.  Pataday 1 drop each eye 1 time per day  D.  Mometasone 0.1% ointment applied to hands 1 time per day  8. For this recent event:    A. Prednisone 10 mg - 1 tablet 1 time per day for 10 days only  9. Return to clinic in 6 months or earlier if problem  Bobby Wheeler should do okay regarding his multiorgan atopic disease if he restarts his benralizumab injections and he consistently uses anti-inflammatory agents for his airway as noted above and uses a short course of systemic steroids.  And his reflux should remain under good control on his current plan.  If there is a problem as he moves forward while using this plan then he can contact me for further evaluation and treatment but otherwise we will see him back in this clinic in 6 months.   Allena Katz, MD Allergy / Immunology [place]

## 2022-03-11 NOTE — Patient Instructions (Addendum)
  1.  RESTART benralizumab injections - sample for home use today  2.  Continue Breztri - 2 inhalations 1-2 times per day with spacer   3.  Continue montelukast 10 mg one tablet one time per day  4. Continue Fluticasone 1-2 puffs each nostril one time per day  5. Continue generic Nexium 63m 1-2 times per day  6. Continue Famotidine 40 mg - 1 tablet at bedtime  7. Can use the following if needed:    A.  Proventil HFA or similar  B.  Cetirizine 10 mg 1 tablet 1 time per day  C.  Pataday 1 drop each eye 1 time per day  D.  Mometasone 0.1% ointment applied to hands 1 time per day  8. For this recent event:    A. Prednisone 10 mg - 1 tablet 1 time per day for 10 days only  9. Return to clinic in 6 months or earlier if problem

## 2022-03-12 ENCOUNTER — Encounter: Payer: Self-pay | Admitting: Allergy and Immunology

## 2022-03-12 ENCOUNTER — Telehealth: Payer: Self-pay | Admitting: *Deleted

## 2022-03-12 NOTE — Telephone Encounter (Signed)
L/m for patient to reach out to me to discuss change to Tezspire and aprpoval and submit to [facility]

## 2022-03-12 NOTE — Telephone Encounter (Signed)
-----   Message from Darrelyn Hillock, [place] sent at 03/11/2022  3:40 PM EST ----- Hi Addie Cederberg,  Dr. Neldon Mc would like patient to restart Fasenra.  I did give him a sample today to take home to self-inject.  Please help.  Thank you- Nickola Major, CMA

## 2022-03-19 NOTE — Telephone Encounter (Signed)
Spoke to patient and advised approval for Tezspire and submit to [employer]

## 2022-04-01 ENCOUNTER — Other Ambulatory Visit: Payer: Self-pay | Admitting: Allergy and Immunology

## 2022-05-03 ENCOUNTER — Other Ambulatory Visit: Payer: Self-pay | Admitting: Allergy and Immunology

## 2022-06-13 ENCOUNTER — Other Ambulatory Visit: Payer: Self-pay | Admitting: Allergy and Immunology

## 2022-12-16 ENCOUNTER — Ambulatory Visit (INDEPENDENT_AMBULATORY_CARE_PROVIDER_SITE_OTHER): Payer: 59 | Admitting: Allergy and Immunology

## 2022-12-16 VITALS — BP 118/74 | HR 80 | Resp 20 | Ht 68.5 in | Wt 204.8 lb

## 2022-12-16 DIAGNOSIS — K219 Gastro-esophageal reflux disease without esophagitis: Secondary | ICD-10-CM | POA: Diagnosis not present

## 2022-12-16 DIAGNOSIS — J3089 Other allergic rhinitis: Secondary | ICD-10-CM

## 2022-12-16 DIAGNOSIS — J320 Chronic maxillary sinusitis: Secondary | ICD-10-CM

## 2022-12-16 DIAGNOSIS — J4550 Severe persistent asthma, uncomplicated: Secondary | ICD-10-CM | POA: Diagnosis not present

## 2022-12-16 DIAGNOSIS — G43909 Migraine, unspecified, not intractable, without status migrainosus: Secondary | ICD-10-CM

## 2022-12-16 MED ORDER — FLUTICASONE PROPIONATE 50 MCG/ACT NA SUSP: NASAL | 5 refills | Status: DC

## 2022-12-16 MED ORDER — PANTOPRAZOLE SODIUM 40 MG PO TBEC: DELAYED_RELEASE_TABLET | ORAL | 5 refills | Status: AC

## 2022-12-16 MED ORDER — AMOXICILLIN-POT CLAVULANATE 875-125 MG PO TABS: ORAL_TABLET | ORAL | 0 refills | Status: DC

## 2022-12-16 MED ORDER — BREZTRI AEROSPHERE 160-9-4.8 MCG/ACT IN AERO: INHALATION_SPRAY | RESPIRATORY_TRACT | 5 refills | Status: AC

## 2022-12-16 NOTE — Progress Notes (Unsigned)
[place] - [place]   Follow-up Note  Referring Provider: Galvin Proffer, MD Primary Provider: Galvin Proffer, MD Date of Office Visit: 12/16/2022  Subjective:   Bobby Wheeler (DOB: 06/22/1950) is a 72 y.o. male who [facility] 12[facility]/2022 in re-evaluation of the following:  HPI: Rodert returns to this clinic in evaluation of severe asthma, allergic rhinoconjunctivitis, reflux, hand eczema.  I last saw him in his clinic 11 March 2022.  His airway is actually doing pretty well he does not have any problems with nasal congestion or sneezing or inability to smell or taste and he has very little issues with wheezing and coughing although occasionally limited of a dry cough at night.  He is no longer using any injections of anti-IL-5 biologic agent and he only uses his Breztri a few times per week and uses nasal steroid infrequently while he continues on montelukast on a regular basis.  He has been developing headaches.  This has been going on for a month or 2 then maybe a little bit longer.  They are pounding headaches located at various areas of his head that are sometimes associated with dizziness but no scotoma or other neurological symptoms with a frequency of approximately 4 times per week but usually last 4 to 5 hours per episode for which she will take Tylenol and usually lay down.  He states that he had an MRI at [facility] which showed some sinusitis.  He has been having problems with reflux.  He has heartburn and regurgitation even while using his Nexium.  He drinks 1 coffee in the morning and then usually has a tea in the evening.  His hand eczema is under excellent control while using some topical mometasone occasionally.  Allergies as of 12/16/2022   No Known Allergies      Medication List    albuterol 108 (90 Base) MCG/ACT inhaler Commonly known as: ProAir HFA Can use two puffs every four to six  hours as needed for cough or wheeze.   amLODipine-benazepril 10-20 MG capsule Commonly known as: LOTREL Take 1 capsule by mouth daily.   aspirin EC 81 MG tablet Take 81 mg by mouth daily.   atorvastatin 40 MG tablet Commonly known as: LIPITOR Take 40 mg by mouth daily.   Breztri Aerosphere 160-9-4.8 MCG/ACT Aero Generic drug: Budeson-Glycopyrrol-Formoterol Inhale two puffs one to two times daily to prevent cough or wheeze.  Rinse, gargle, and spit after use.  Use with spacer.   cetirizine 10 MG tablet Commonly known as: ZYRTEC Take 1 tablet (10 mg total) by mouth daily as needed for allergies.   esomeprazole 40 MG capsule Commonly known as: NEXIUM TAKE 1 TO 2 CAPSULES BY MOUTH ONCE DAILY   famotidine 40 MG tablet Commonly known as: PEPCID TAKE 1 TABLET BY MOUTH AT  BEDTIME   FISH OIL PO Take by mouth daily.   fluticasone 50 MCG/ACT nasal spray Commonly known as: FLONASE Use one to two sprays in each nostril once daily as directed.   hydrochlorothiazide 25 MG tablet Commonly known as: HYDRODIURIL Take 25 mg by mouth daily.   ibuprofen 800 MG tablet Commonly known as: ADVIL Take 800 mg by mouth every 8 (eight) hours as needed. for pain   metFORMIN 1000 MG tablet Commonly known as: GLUCOPHAGE 1,000 mg. Take two tablets once daily as directed.   mometasone 0.1 % ointment Commonly known as: ELOCON Apply to affected  areas 1 time per day   montelukast 10 MG tablet Commonly known as: SINGULAIR TAKE 1 TABLET BY MOUTH ONCE DAILY NEED  OFFICE  VISIT  FOR  FURTHER  REFILLS   MULTIVITAMIN ADULT PO Take by mouth daily.   Olopatadine HCl 0.2 % Soln Commonly known as: Pataday Use one drop in each eye once daily if needed   pioglitazone 30 MG tablet Commonly known as: ACTOS Take 30 mg by mouth daily.   tadalafil 10 MG tablet Commonly known as: CIALIS Take 10 mg by mouth daily.   tiZANidine 4 MG tablet Commonly known as: ZANAFLEX Take 4 mg by mouth daily as  needed.   Trulicity 3 MG/0.5ML Soaj Generic drug: Dulaglutide SMARTSIG:3 Milligram(s) SUB-Q Once a Week   Vitamin D (Ergocalciferol) 1.25 MG (50000 UNIT) Caps capsule Commonly known as: DRISDOL Take 50,000 Units by mouth once a week.   zolpidem 10 MG tablet Commonly known as: AMBIEN     Past Medical History:  Diagnosis Date   Asthma    Diabetes ([facility])    Hypertension    LPRD (laryngopharyngeal reflux disease)     Past Surgical History:  Procedure Laterality Date   CATARACT EXTRACTION  2020   HERNIA REPAIR      Review of systems negative except as noted in HPI / PMHx or noted below:  Review of Systems  Constitutional: Negative.   HENT: Negative.    Eyes: Negative.   Respiratory: Negative.    Cardiovascular: Negative.   Gastrointestinal: Negative.   Genitourinary: Negative.   Musculoskeletal: Negative.   Skin: Negative.   Neurological: Negative.   Endo/Heme/Allergies: Negative.   Psychiatric/Behavioral: Negative.       Objective:   Vitals:   12/16/22 1645  BP: 118/74  Pulse: 80  Resp: 20  SpO2: 96%   Height: 5' 8.5" (174 cm)  Weight: 204 lb 12.8 oz (92.9 kg)   Physical Exam Constitutional:      Appearance: He is not diaphoretic.  HENT:     Head: Normocephalic.     Right Ear: Tympanic membrane, ear canal and external ear normal.     Left Ear: Tympanic membrane, ear canal and external ear normal.     Nose: Nose normal. No mucosal edema or rhinorrhea.     Mouth/Throat:     Pharynx: Uvula midline. No oropharyngeal exudate.  Eyes:     Conjunctiva/sclera: Conjunctivae normal.  Neck:     Thyroid: No thyromegaly.     Trachea: Trachea normal. No tracheal tenderness or tracheal deviation.  Cardiovascular:     Rate and Rhythm: Normal rate and regular rhythm.     Heart sounds: Normal heart sounds, S1 normal and S2 normal. No murmur heard. Pulmonary:     Effort: No respiratory distress.     Breath sounds: Normal breath sounds. No stridor. No wheezing or  rales.  Lymphadenopathy:     Head:     Right side of head: No tonsillar adenopathy.     Left side of head: No tonsillar adenopathy.     Cervical: No cervical adenopathy.  Skin:    Findings: No erythema or rash.     Nails: There is no clubbing.  Neurological:     Mental Status: He is alert.     Diagnostics: Spirometry was performed and demonstrated an FEV1 of 1.95 at 68 % of predicted.  Results of a head CT scan obtained 08 November 2022 identifies mild to moderate mucosal thickening within the bilateral maxillary sinuses.  Assessment  and Plan:   1. Not well controlled severe persistent asthma   2. Other allergic rhinitis   3. LPRD (laryngopharyngeal reflux disease)   4. Chronic maxillary sinusitis    1.  Continue Breztri - 2 inhalations 1-2 times per day with spacer   2.  Continue montelukast 10 mg one tablet one time per day  3. Continue Fluticasone 1-2 puffs each nostril one time per day  4. Treat reflux:   A. Minimize caffeine consumption  B. Pantoprazole 40 mg - 1 tablet 2 times per day (replaces Esomeprazole)  C. Famotidine 40 mg - 1 tablet in PM  D. If not better, visit with GI doctor  5. Treat headache / sinus:   A. Consistently use nasal fluticasone every day  B. Antibiotic: Augmentin 875 - 1 tablet 2 times per day for 10 days  C. Prednisone 10 mg - 1 tablet 1 time per day for 10 days  6. Can use the following if needed:    A.  Albuterol - 2 inhalations every 6 hours  B.  Cetirizine 10 mg 1 tablet 1 time per day  C.  Pataday 1 drop each eye 1 time per day  D.  Mometasone 0.1% ointment applied to hands 1 time per day  7. Obtain flu vaccine  8. Return to clinic in 6 months or earlier if problem  Khyrin has a few active medical issues that require attention above and beyond his asthma.  He is having bad reflux and we will change around his medical therapy to include a proton pump inhibitor twice a day along with some famotidine and if he is not better then he  is to visit with a gastroenterologist.  He has what appears to be some maxillary sinus inflammation and we will treat him with the therapy noted above which includes an antibiotic and systemic anti-inflammatory medicines and have him consistently use a nasal steroid and hopefully this will result in resolution of his headaches.  If not, he will require further evaluation.  He has a collection of other agents to be utilized as needed as noted above.  Assuming he does well I will see him back in this clinic in 6 months or earlier if there is a problem.  Laurette Schimke, MD Allergy / Immunology [facility]

## 2022-12-16 NOTE — Patient Instructions (Addendum)
  1.  Continue Breztri - 2 inhalations 1-2 times per day with spacer   2.  Continue montelukast 10 mg one tablet one time per day  3. Continue Fluticasone 1-2 puffs each nostril one time per day  4. Treat reflux:   A. Minimize caffeine consumption  B. Pantoprazole 40 mg - 1 tablet 2 times per day (replaces Esomeprazole)  C. Famotidine 40 mg - 1 tablet in PM  D. If not better, visit with GI doctor  5. Treat headache / sinus:   A. Consistently use nasal fluticasone every day  B. Antibiotic: Augmentin 875 - 1 tablet 2 times per day for 10 days  C. Prednisone 10 mg - 1 tablet 1 time per day for 10 days  6. Can use the following if needed:    A.  Albuterol - 2 inhalations every 6 hours  B.  Cetirizine 10 mg 1 tablet 1 time per day  C.  Pataday 1 drop each eye 1 time per day  D.  Mometasone 0.1% ointment applied to hands 1 time per day  7. Obtain flu vaccine  8. Return to clinic in 6 months or earlier if problem

## 2022-12-17 ENCOUNTER — Encounter: Payer: Self-pay | Admitting: Allergy and Immunology

## 2023-05-29 ENCOUNTER — Encounter: Payer: Self-pay | Admitting: Allergy and Immunology

## 2023-05-29 ENCOUNTER — Ambulatory Visit (INDEPENDENT_AMBULATORY_CARE_PROVIDER_SITE_OTHER): Admitting: Allergy and Immunology

## 2023-05-29 VITALS — BP 124/72 | HR 87 | Resp 16

## 2023-05-29 DIAGNOSIS — K219 Gastro-esophageal reflux disease without esophagitis: Secondary | ICD-10-CM

## 2023-05-29 DIAGNOSIS — L309 Dermatitis, unspecified: Secondary | ICD-10-CM

## 2023-05-29 DIAGNOSIS — H1010 Acute atopic conjunctivitis, unspecified eye: Secondary | ICD-10-CM

## 2023-05-29 DIAGNOSIS — J301 Allergic rhinitis due to pollen: Secondary | ICD-10-CM

## 2023-05-29 DIAGNOSIS — H1013 Acute atopic conjunctivitis, bilateral: Secondary | ICD-10-CM

## 2023-05-29 DIAGNOSIS — J3089 Other allergic rhinitis: Secondary | ICD-10-CM

## 2023-05-29 DIAGNOSIS — J4550 Severe persistent asthma, uncomplicated: Secondary | ICD-10-CM

## 2023-05-29 MED ORDER — FAMOTIDINE 40 MG PO TABS: 40.0000 mg | ORAL_TABLET | Freq: Every day | ORAL | 1 refills | Status: AC

## 2023-05-29 MED ORDER — METHYLPREDNISOLONE ACETATE 80 MG/ML IJ SUSP
80.0000 mg | Freq: Once | INTRAMUSCULAR | Status: AC
  Administered 2023-05-29: 80 mg via INTRAMUSCULAR

## 2023-05-29 MED ORDER — MONTELUKAST SODIUM 10 MG PO TABS: ORAL_TABLET | ORAL | 1 refills | Status: AC

## 2023-05-29 NOTE — Patient Instructions (Addendum)
  1.  Continue Breztri  - 2 inhalations 1-2 times per day with spacer   2.  Continue montelukast  10 mg one tablet one time per day  3. Continue Fluticasone  1-2 puffs each nostril one time per day  4. Treat reflux:   A. Minimize caffeine consumption  B. Pantoprazole  40 mg - 1 tablet 1-2 times per day    C. Famotidine  40 mg - 1 tablet in PM  5. Can use the following if needed:    A.  Albuterol  - 2 inhalations every 6 hours  B.  Cetirizine  10 mg 1 tablet 1 time per day  C.  Pataday  1 drop each eye 1 time per day  D.  Mometasone  0.1% ointment applied to hands 1 time per day  6. For this spring time flare up:   A. Depomedrol 80 mg IM delivered in clinic today  7. Influenza = Tamiflu. Covid = Paxlovid  8. Return to clinic in 6 months or earlier if problem

## 2023-05-29 NOTE — Progress Notes (Signed)
 [place] [facility] [place] [facility] [place] [facility] [place] [facility] [place]   Follow-up Note  Referring Provider: Georgean Kindle, MD Primary Provider: Georgean Kindle, MD Date of Office Visit: 05/29/2023  Subjective:   Bobby Wheeler (DOB: 01-17-1950) is a 73 y.o. male who [facility] in re-evaluation of the following:  HPI: Bobby Wheeler returns to this clinic in evaluation of asthma, allergic rhinoconjunctivitis, reflux, hand eczema.  I last saw him in this clinic 16 December 2022.  As it is usually the case he has had a very difficult time this spring with lots of nasal congestion and sneezing and itchy red watery eyes and a headache especially on the left side of his head.  Fortunately, his asthma has not been flaring and he rarely uses a short acting bronchodilator.  He continues to use anti-inflammatory agents to address his atopic disease yet still remains symptomatic as described above.  His reflux is under very good control at this point in time while using a proton pump inhibitor and H2 receptor blocker.  His skin has also been doing very well while he intermittently and rarely uses any topical mometasone  to his hands.  He did develop a dermatitis affecting his right chest that is not itchy and he visited with his primary care doctor yesterday who gave him a prescription for topical triamcinolone.  Allergies as of 05/29/2023   No Known Allergies      Medication List    albuterol  108 (90 Base) MCG/ACT inhaler Commonly known as: ProAir  HFA Can use two puffs every four to six hours as needed for cough or wheeze.   amLODipine-benazepril 10-20 MG capsule Commonly known as: LOTREL Take 1 capsule by mouth daily.   aspirin EC 81 MG tablet Take 81 mg by mouth daily.   atorvastatin 40 MG tablet Commonly known as: LIPITOR Take 40 mg by mouth daily.   Breztri  Aerosphere 160-9-4.8 MCG/ACT Aero inhaler Generic drug: budesonide-glycopyrrolate-formoterol Inhale  two puffs one to two times daily to prevent cough or wheeze.  Rinse, gargle, and spit after use.  Use with spacer.   cetirizine  10 MG tablet Commonly known as: ZYRTEC  Take 1 tablet (10 mg total) by mouth daily as needed for allergies.   famotidine  40 MG tablet Commonly known as: PEPCID  Take 1 tablet (40 mg total) by mouth at bedtime.   FISH OIL PO Take by mouth daily.   hydrochlorothiazide 25 MG tablet Commonly known as: HYDRODIURIL Take 25 mg by mouth daily.   ibuprofen 800 MG tablet Commonly known as: ADVIL Take 800 mg by mouth every 8 (eight) hours as needed. for pain   metFORMIN 1000 MG tablet Commonly known as: GLUCOPHAGE 1,000 mg. Take two tablets once daily as directed.   mometasone  0.1 % ointment Commonly known as: ELOCON  Apply to affected areas 1 time per day   montelukast  10 MG tablet Commonly known as: SINGULAIR  Take one tablet by mouth once daily.   MULTIVITAMIN ADULT PO Take by mouth daily.   Olopatadine  HCl 0.2 % Soln Commonly known as: Pataday  Use one drop in each eye once daily if needed   pantoprazole  40 MG tablet Commonly known as: PROTONIX  Take one tablet by mouth twice daily as directed.   pioglitazone 30 MG tablet Commonly known as: ACTOS Take 30 mg by mouth daily.   tadalafil 10 MG tablet Commonly known as: CIALIS Take 10 mg by mouth daily.   tiZANidine 4 MG tablet Commonly known as: ZANAFLEX  Take 4 mg by mouth daily as needed.   Trulicity 3 MG/0.5ML Soaj Generic drug: Dulaglutide SMARTSIG:3 Milligram(s) SUB-Q Once a Week   Vitamin D (Ergocalciferol) 1.25 MG (50000 UNIT) Caps capsule Commonly known as: DRISDOL Take 50,000 Units by mouth once a week.   zolpidem 10 MG tablet Commonly known as: AMBIEN    Past Medical History:  Diagnosis Date   Asthma    Diabetes (HCC)    Hypertension    LPRD (laryngopharyngeal reflux disease)     Past Surgical History:  Procedure Laterality Date   CATARACT EXTRACTION  2020   HERNIA  REPAIR      Review of systems negative except as noted in HPI / PMHx or noted below:  Review of Systems  Constitutional: Negative.   HENT: Negative.    Eyes: Negative.   Respiratory: Negative.    Cardiovascular: Negative.   Gastrointestinal: Negative.   Genitourinary: Negative.   Musculoskeletal: Negative.   Skin: Negative.   Neurological: Negative.   Endo/Heme/Allergies: Negative.   Psychiatric/Behavioral: Negative.       Objective:   Vitals:   06/02/23 1036  BP: 124/72  Pulse: 87  Resp: 16  SpO2: 99%          Physical Exam Constitutional:      Appearance: He is not diaphoretic.  HENT:     Head: Normocephalic.     Right Ear: Tympanic membrane, ear canal and external ear normal.     Left Ear: Tympanic membrane, ear canal and external ear normal.     Nose: Nose normal. No mucosal edema or rhinorrhea.     Mouth/Throat:     Pharynx: Uvula midline. No oropharyngeal exudate.  Eyes:     Conjunctiva/sclera: Conjunctivae normal.  Neck:     Thyroid: No thyromegaly.     Trachea: Trachea normal. No tracheal tenderness or tracheal deviation.  Cardiovascular:     Rate and Rhythm: Normal rate and regular rhythm.     Heart sounds: Normal heart sounds, S1 normal and S2 normal. No murmur heard. Pulmonary:     Effort: No respiratory distress.     Breath sounds: Normal breath sounds. No stridor. No wheezing or rales.  Lymphadenopathy:     Head:     Right side of head: No tonsillar adenopathy.     Left side of head: No tonsillar adenopathy.     Cervical: No cervical adenopathy.  Skin:    Findings: No erythema or rash (Hyperpigmented indurated patchy dermatitis right pectoral region).     Nails: There is no clubbing.  Neurological:     Mental Status: He is alert.     Diagnostics: none  Assessment and Plan:   1. Asthma, severe persistent, well-controlled   2. Perennial allergic rhinitis   3. Seasonal allergic rhinitis due to pollen   4. Seasonal allergic  conjunctivitis   5. Hand eczema   6. LPRD (laryngopharyngeal reflux disease)    1.  Continue Breztri  - 2 inhalations 1-2 times per day with spacer   2.  Continue montelukast  10 mg one tablet one time per day  3. Continue Fluticasone  1-2 puffs each nostril one time per day  4. Treat reflux:   A. Minimize caffeine consumption  B. Pantoprazole  40 mg - 1 tablet 1-2 times per day    C. Famotidine  40 mg - 1 tablet in PM  5. Can use the following if needed:    A.  Albuterol  - 2 inhalations every 6 hours  B.  Cetirizine   10 mg 1 tablet 1 time per day  C.  Pataday  1 drop each eye 1 time per day  D.  Mometasone  0.1% ointment applied to hands 1 time per day  6. For this spring time flare up:   A. Depomedrol 80 mg IM delivered in clinic today  7. Influenza = Tamiflu. Covid = Paxlovid  8. Return to clinic in 6 months or earlier if problem  Londell was administered a systemic steroid today which is going to help with his multiorgan atopic flare and I assume that he will do well in the face of this plan while he continues to use anti-inflammatory agents for his airway.  And his reflux appears to be under very good control at this point on his current treatment.  Assuming he does well while utilizing the plan noted above I will see him back in this clinic in 6 months or earlier if there is a problem.  Schuyler Custard, MD Allergy / Immunology [facility]

## 2023-06-03 ENCOUNTER — Encounter: Payer: Self-pay | Admitting: Allergy and Immunology

## 2023-12-12 ENCOUNTER — Other Ambulatory Visit: Payer: Self-pay | Admitting: Allergy and Immunology

## 2023-12-17 ENCOUNTER — Other Ambulatory Visit: Payer: Self-pay | Admitting: Allergy and Immunology

## 2023-12-18 ENCOUNTER — Other Ambulatory Visit: Payer: Self-pay | Admitting: *Deleted

## 2023-12-18 MED ORDER — FLUTICASONE PROPIONATE 50 MCG/ACT NA SUSP: NASAL | 0 refills | Status: AC
